# Patient Record
Sex: Male | Born: 1965 | Race: White | Hispanic: No | Marital: Married | Smoking: Never smoker
Health system: Southern US, Community
[De-identification: ages and names within clinical notes are randomized; demographics above are authoritative.]

## PROBLEM LIST (undated history)

## (undated) DIAGNOSIS — R011 Cardiac murmur, unspecified: Secondary | ICD-10-CM

## (undated) DIAGNOSIS — C8190 Hodgkin lymphoma, unspecified, unspecified site: Secondary | ICD-10-CM

## (undated) DIAGNOSIS — E079 Disorder of thyroid, unspecified: Secondary | ICD-10-CM

## (undated) DIAGNOSIS — C8590 Non-Hodgkin lymphoma, unspecified, unspecified site: Secondary | ICD-10-CM

## (undated) DIAGNOSIS — I10 Essential (primary) hypertension: Secondary | ICD-10-CM

## (undated) DIAGNOSIS — K219 Gastro-esophageal reflux disease without esophagitis: Secondary | ICD-10-CM

## (undated) DIAGNOSIS — G4730 Sleep apnea, unspecified: Secondary | ICD-10-CM

## (undated) HISTORY — PX: CHOLECYSTECTOMY: SHX55

## (undated) HISTORY — PX: SPLENECTOMY: SUR1306

## (undated) HISTORY — PX: OTHER SURGICAL HISTORY: SHX169

---

## 2018-09-05 ENCOUNTER — Emergency Department (HOSPITAL_COMMUNITY): Payer: PRIVATE HEALTH INSURANCE

## 2018-09-05 ENCOUNTER — Other Ambulatory Visit: Payer: Self-pay

## 2018-09-05 ENCOUNTER — Encounter (HOSPITAL_COMMUNITY): Payer: Self-pay | Admitting: Emergency Medicine

## 2018-09-05 ENCOUNTER — Inpatient Hospital Stay (HOSPITAL_COMMUNITY)
Admission: EM | Admit: 2018-09-05 | Discharge: 2018-09-09 | DRG: 175 | Disposition: A | Discharge status: Home / Self Care | Payer: PRIVATE HEALTH INSURANCE | Attending: Internal Medicine | Admitting: Internal Medicine

## 2018-09-05 DIAGNOSIS — G4700 Insomnia, unspecified: Secondary | ICD-10-CM | POA: Diagnosis present

## 2018-09-05 DIAGNOSIS — E039 Hypothyroidism, unspecified: Secondary | ICD-10-CM | POA: Diagnosis present

## 2018-09-05 DIAGNOSIS — R7989 Other specified abnormal findings of blood chemistry: Secondary | ICD-10-CM | POA: Diagnosis present

## 2018-09-05 DIAGNOSIS — F419 Anxiety disorder, unspecified: Secondary | ICD-10-CM | POA: Diagnosis not present

## 2018-09-05 DIAGNOSIS — F329 Major depressive disorder, single episode, unspecified: Secondary | ICD-10-CM | POA: Diagnosis present

## 2018-09-05 DIAGNOSIS — Z79899 Other long term (current) drug therapy: Secondary | ICD-10-CM

## 2018-09-05 DIAGNOSIS — Z8571 Personal history of Hodgkin lymphoma: Secondary | ICD-10-CM

## 2018-09-05 DIAGNOSIS — I2699 Other pulmonary embolism without acute cor pulmonale: Secondary | ICD-10-CM | POA: Diagnosis not present

## 2018-09-05 DIAGNOSIS — I2609 Other pulmonary embolism with acute cor pulmonale: Secondary | ICD-10-CM | POA: Diagnosis present

## 2018-09-05 DIAGNOSIS — I10 Essential (primary) hypertension: Secondary | ICD-10-CM | POA: Diagnosis present

## 2018-09-05 DIAGNOSIS — I351 Nonrheumatic aortic (valve) insufficiency: Secondary | ICD-10-CM | POA: Diagnosis not present

## 2018-09-05 DIAGNOSIS — K222 Esophageal obstruction: Secondary | ICD-10-CM | POA: Diagnosis present

## 2018-09-05 DIAGNOSIS — E785 Hyperlipidemia, unspecified: Secondary | ICD-10-CM | POA: Diagnosis present

## 2018-09-05 DIAGNOSIS — I2601 Septic pulmonary embolism with acute cor pulmonale: Secondary | ICD-10-CM | POA: Diagnosis not present

## 2018-09-05 DIAGNOSIS — Z9081 Acquired absence of spleen: Secondary | ICD-10-CM

## 2018-09-05 DIAGNOSIS — F320 Major depressive disorder, single episode, mild: Secondary | ICD-10-CM | POA: Diagnosis not present

## 2018-09-05 DIAGNOSIS — Z880 Allergy status to penicillin: Secondary | ICD-10-CM | POA: Diagnosis not present

## 2018-09-05 DIAGNOSIS — F411 Generalized anxiety disorder: Secondary | ICD-10-CM | POA: Diagnosis not present

## 2018-09-05 DIAGNOSIS — K219 Gastro-esophageal reflux disease without esophagitis: Secondary | ICD-10-CM | POA: Diagnosis present

## 2018-09-05 DIAGNOSIS — Z885 Allergy status to narcotic agent status: Secondary | ICD-10-CM

## 2018-09-05 DIAGNOSIS — F5101 Primary insomnia: Secondary | ICD-10-CM | POA: Diagnosis not present

## 2018-09-05 DIAGNOSIS — Z20828 Contact with and (suspected) exposure to other viral communicable diseases: Secondary | ICD-10-CM | POA: Diagnosis present

## 2018-09-05 DIAGNOSIS — F5102 Adjustment insomnia: Secondary | ICD-10-CM | POA: Diagnosis not present

## 2018-09-05 DIAGNOSIS — Z79890 Hormone replacement therapy (postmenopausal): Secondary | ICD-10-CM | POA: Diagnosis not present

## 2018-09-05 DIAGNOSIS — R0902 Hypoxemia: Secondary | ICD-10-CM | POA: Diagnosis not present

## 2018-09-05 DIAGNOSIS — Z9049 Acquired absence of other specified parts of digestive tract: Secondary | ICD-10-CM | POA: Diagnosis not present

## 2018-09-05 HISTORY — DX: Gastro-esophageal reflux disease without esophagitis: K21.9

## 2018-09-05 HISTORY — DX: Disorder of thyroid, unspecified: E07.9

## 2018-09-05 HISTORY — DX: Essential (primary) hypertension: I10

## 2018-09-05 HISTORY — DX: Sleep apnea, unspecified: G47.30

## 2018-09-05 HISTORY — DX: Cardiac murmur, unspecified: R01.1

## 2018-09-05 HISTORY — DX: Non-Hodgkin lymphoma, unspecified, unspecified site: C85.90

## 2018-09-05 HISTORY — DX: Hodgkin lymphoma, unspecified, unspecified site: C81.90

## 2018-09-05 LAB — CBC WITH DIFFERENTIAL/PLATELET
Abs Immature Granulocytes: 0.07 10*3/uL (ref 0.00–0.07)
Basophils Absolute: 0.1 10*3/uL (ref 0.0–0.1)
Basophils Relative: 1 %
Eosinophils Absolute: 0.1 10*3/uL (ref 0.0–0.5)
Eosinophils Relative: 1 %
HCT: 41.2 % (ref 39.0–52.0)
Hemoglobin: 14 g/dL (ref 13.0–17.0)
Immature Granulocytes: 1 %
Lymphocytes Relative: 11 %
Lymphs Abs: 1.4 10*3/uL (ref 0.7–4.0)
MCH: 37.8 pg — ABNORMAL HIGH (ref 26.0–34.0)
MCHC: 34 g/dL (ref 30.0–36.0)
MCV: 111.4 fL — ABNORMAL HIGH (ref 80.0–100.0)
Monocytes Absolute: 0.9 10*3/uL (ref 0.1–1.0)
Monocytes Relative: 7 %
Neutro Abs: 10.6 10*3/uL — ABNORMAL HIGH (ref 1.7–7.7)
Neutrophils Relative %: 79 %
Platelets: 144 10*3/uL — ABNORMAL LOW (ref 150–400)
RBC: 3.7 MIL/uL — ABNORMAL LOW (ref 4.22–5.81)
RDW: 15.4 % (ref 11.5–15.5)
WBC: 13.2 10*3/uL — ABNORMAL HIGH (ref 4.0–10.5)
nRBC: 2.4 % — ABNORMAL HIGH (ref 0.0–0.2)

## 2018-09-05 LAB — D-DIMER, QUANTITATIVE: D-Dimer, Quant: 6.16 ug/mL-FEU — ABNORMAL HIGH (ref 0.00–0.50)

## 2018-09-05 LAB — BASIC METABOLIC PANEL
Anion gap: 16 — ABNORMAL HIGH (ref 5–15)
BUN: 13 mg/dL (ref 6–20)
CO2: 20 mmol/L — ABNORMAL LOW (ref 22–32)
Calcium: 8.5 mg/dL — ABNORMAL LOW (ref 8.9–10.3)
Chloride: 96 mmol/L — ABNORMAL LOW (ref 98–111)
Creatinine, Ser: 1.19 mg/dL (ref 0.61–1.24)
GFR calc Af Amer: >60 mL/min (ref >60)
GFR calc non Af Amer: >60 mL/min (ref >60)
Glucose, Bld: 108 mg/dL — ABNORMAL HIGH (ref 70–99)
Potassium: 4 mmol/L (ref 3.5–5.1)
Sodium: 132 mmol/L — ABNORMAL LOW (ref 135–145)

## 2018-09-05 LAB — ECHOCARDIOGRAM COMPLETE
Height: 71.5 in
Weight: 3744 oz

## 2018-09-05 LAB — HEPARIN LEVEL (UNFRACTIONATED)
Heparin Unfractionated: 0.26 IU/mL — ABNORMAL LOW (ref 0.30–0.70)
Heparin Unfractionated: 0.52 IU/mL (ref 0.30–0.70)

## 2018-09-05 LAB — TROPONIN I (HIGH SENSITIVITY): Troponin I (High Sensitivity): 21.3 ng/L — ABNORMAL HIGH (ref <18)

## 2018-09-05 LAB — TROPONIN I: Troponin I: 0.04 ng/mL (ref <0.03)

## 2018-09-05 LAB — SARS CORONAVIRUS 2 BY RT PCR (HOSPITAL ORDER, PERFORMED IN ~~LOC~~ HOSPITAL LAB): SARS Coronavirus 2: NEGATIVE

## 2018-09-05 LAB — MRSA PCR SCREENING: MRSA by PCR: NEGATIVE

## 2018-09-05 LAB — BRAIN NATRIURETIC PEPTIDE: B Natriuretic Peptide: 495 pg/mL — ABNORMAL HIGH (ref 0.0–100.0)

## 2018-09-05 MED ORDER — SODIUM CHLORIDE (PF) 0.9 % IJ SOLN
INTRAMUSCULAR | Status: AC
  Administered 2018-09-05: 10:00:00
  Filled 2018-09-05: qty 50

## 2018-09-05 MED ORDER — HEPARIN (PORCINE) 25000 UT/250ML-% IV SOLN
1900.0000 [IU]/h | INTRAVENOUS | Status: AC
  Administered 2018-09-06 – 2018-09-08 (×4): 1900 [IU]/h via INTRAVENOUS
  Filled 2018-09-05 (×5): qty 250

## 2018-09-05 MED ORDER — PERFLUTREN LIPID MICROSPHERE
1.0000 mL | INTRAVENOUS | Status: AC | PRN
  Administered 2018-09-05: 12:00:00 2 mL via INTRAVENOUS
  Filled 2018-09-05: qty 10

## 2018-09-05 MED ORDER — HEPARIN BOLUS VIA INFUSION
1500.0000 [IU] | Freq: Once | INTRAVENOUS | Status: AC
  Administered 2018-09-05: 17:00:00 1500 [IU] via INTRAVENOUS
  Filled 2018-09-05: qty 1500

## 2018-09-05 MED ORDER — OXYCODONE-ACETAMINOPHEN 5-325 MG PO TABS
1.0000 | ORAL_TABLET | ORAL | Status: DC | PRN
  Administered 2018-09-05 – 2018-09-06 (×4): 1 via ORAL
  Filled 2018-09-05 (×4): qty 1

## 2018-09-05 MED ORDER — HEPARIN (PORCINE) 25000 UT/250ML-% IV SOLN
1700.0000 [IU]/h | INTRAVENOUS | Status: DC
  Administered 2018-09-05: 09:00:00 1700 [IU]/h via INTRAVENOUS
  Filled 2018-09-05: qty 250

## 2018-09-05 MED ORDER — CHLORHEXIDINE GLUCONATE CLOTH 2 % EX PADS
6.0000 | MEDICATED_PAD | Freq: Every day | CUTANEOUS | Status: DC
  Administered 2018-09-05 – 2018-09-08 (×4): 6 via TOPICAL

## 2018-09-05 MED ORDER — HEPARIN BOLUS VIA INFUSION
3000.0000 [IU] | Freq: Once | INTRAVENOUS | Status: AC
  Administered 2018-09-05: 3000 [IU] via INTRAVENOUS
  Filled 2018-09-05: qty 3000

## 2018-09-05 MED ORDER — IOHEXOL 350 MG/ML SOLN
100.0000 mL | Freq: Once | INTRAVENOUS | Status: AC | PRN
  Administered 2018-09-05: 10:00:00 100 mL via INTRAVENOUS

## 2018-09-05 MED ORDER — ASPIRIN 81 MG PO CHEW
324.0000 mg | CHEWABLE_TABLET | Freq: Once | ORAL | Status: AC
  Administered 2018-09-05: 07:00:00 324 mg via ORAL
  Filled 2018-09-05: qty 4

## 2018-09-05 MED ORDER — FENTANYL CITRATE (PF) 100 MCG/2ML IJ SOLN
25.0000 ug | Freq: Once | INTRAMUSCULAR | Status: AC
  Administered 2018-09-05: 25 ug via INTRAVENOUS
  Filled 2018-09-05: qty 2

## 2018-09-05 MED ORDER — SODIUM CHLORIDE 0.9 % IV BOLUS
1000.0000 mL | Freq: Once | INTRAVENOUS | Status: AC
  Administered 2018-09-05: 08:00:00 1000 mL via INTRAVENOUS

## 2018-09-05 NOTE — Progress Notes (Signed)
Pharmacy: [facility]  Patient's a 53 y.o M currently on heparin drip for acute PE with evidence of right heart strain.    First heparin level now back therapeutic at 0.52 (goal 0.3-0.7). Per pt's RN, no bleeding noted   Plan: - continue heparin drip at 1900 units/hr - Daily CBC/HL - monitor for s/s bleeding  Dorrene German 09/05/2018 11:59 PM

## 2018-09-05 NOTE — Progress Notes (Signed)
  Echocardiogram 2D Echocardiogram has been performed.  Bradley Campos 09/05/2018, 12:50 PM

## 2018-09-05 NOTE — Progress Notes (Signed)
[facility] Progress Note Patient Name: Bradley Campos DOB: 09-17-1965 MRN: [id number]   Date of Service  09/05/2018  HPI/Events of Note  Pt with chest discomfort due to known PE + headache  eICU Interventions  Percocet 1 tab po Q 4 hour prn pain        Frederik Pear 09/05/2018, 8:07 PM

## 2018-09-05 NOTE — Progress Notes (Signed)
He remains hemodynamically stable Requiring 2 L of oxygen  Elevated BNP and troponin  Echocardiogram noted  He does have right ventricular strain, but, stable hemodynamics and does not appear to be symptomatic from it at present  We will continue to monitor closely, continue current anticoagulation Catheter directed thrombolysis may be considered if any signs of decompensation

## 2018-09-05 NOTE — ED Notes (Signed)
ED TO INPATIENT HANDOFF REPORT  ED Nurse Name and Phone #: Nydia Bouton, RN  S Name/Age/Gender Randall Hiss Wyss 53 y.o. male Room/Bed: WA13/WA13  Code Status   Code Status: Full Code  Home/SNF/Other Home Patient oriented to: self, place, time and situation Is this baseline? Yes   Triage Complete: Triage complete  Chief Complaint Shortness of Breath  Triage Note Arrives via EMS from home. C/C SOB x6 hours, HA all night. Lung sounds clear, patient in no acute distress on RA. COVID negative as of Wednesday.   Pt reporting increasing shortness of breath with exertion.    Allergies Allergies  Allergen Reactions  . Codeine Hives  . Penicillins Hives    Did it involve swelling of the face/tongue/throat, SOB, or low BP? No Did it involve sudden or severe rash/hives, skin peeling, or any reaction on the inside of your mouth or nose? No Did you need to seek medical attention at a hospital or doctor's office? No When did it last happen?Childhood If all above answers are "NO", may proceed with cephalosporin use.     Level of Care/Admitting Diagnosis ED Disposition    ED Disposition Condition [facility] Area: [place] [[id number]]  Level of Care: ICU [6]  Covid Evaluation: Confirmed COVID Negative  Diagnosis: Pulmonary embolism [facility]) [[id number]]  Admitting Physician: Laurin Coder [[id number]]  Attending Physician: Laurin Coder [[id number]]  Estimated length of stay: past midnight tomorrow  Certification:: I certify this patient will need inpatient services for at least 2 midnights  PT Class (Do Not Modify): Inpatient [101]  PT Acc Code (Do Not Modify): Private [1]       B Medical/Surgery History Past Medical History:  Diagnosis Date  . Hodgkin's disease ([place])   . Hypertension   . Thyroid disease    Past Surgical History:  Procedure Laterality Date  . SPLENECTOMY       A IV Location/Drains/Wounds Patient Lines/Drains/Airways Status    Active Line/Drains/Airways    Name:   Placement date:   Placement time:   Site:   Days:   Peripheral IV 09/05/18 Left Antecubital   09/05/18    0710    Antecubital   less than 1   Peripheral IV 09/05/18 Right Antecubital   09/05/18    0909    Antecubital   less than 1          Intake/Output Last 24 hours  Intake/Output Summary (Last 24 hours) at 09/05/2018 1526 Last data filed at 09/05/2018 0909 Gross per 24 hour  Intake 1000 ml  Output -  Net 1000 ml    Labs/Imaging Results for orders placed or performed during the hospital encounter of 09/05/18 (from the past 48 hour(s))  CBC with Differential     Status: Abnormal   Collection Time: 09/05/18  7:04 AM  Result Value Ref Range   WBC 13.2 (H) 4.0 - 10.5 K/uL   RBC 3.70 (L) 4.22 - 5.81 MIL/uL   Hemoglobin 14.0 13.0 - 17.0 g/dL   HCT 41.2 39.0 - 52.0 %   MCV 111.4 (H) 80.0 - 100.0 fL   MCH 37.8 (H) 26.0 - 34.0 pg   MCHC 34.0 30.0 - 36.0 g/dL   RDW 15.4 11.5 - 15.5 %   Platelets 144 (L) 150 - 400 K/uL   nRBC 2.4 (H) 0.0 - 0.2 %   Neutrophils Relative % 79 %   Neutro Abs 10.6 (H) 1.7 - 7.7 K/uL   Lymphocytes Relative 11 %  Lymphs Abs 1.4 0.7 - 4.0 K/uL   Monocytes Relative 7 %   Monocytes Absolute 0.9 0.1 - 1.0 K/uL   Eosinophils Relative 1 %   Eosinophils Absolute 0.1 0.0 - 0.5 K/uL   Basophils Relative 1 %   Basophils Absolute 0.1 0.0 - 0.1 K/uL   Immature Granulocytes 1 %   Abs Immature Granulocytes 0.07 0.00 - 0.07 K/uL   Polychromasia PRESENT     Comment: Performed at [facility], [place] [address] [place], [place] [address]  Basic metabolic panel     Status: Abnormal   Collection Time: 09/05/18  7:04 AM  Result Value Ref Range   Sodium 132 (L) 135 - 145 mmol/L   Potassium 4.0 3.5 - 5.1 mmol/L   Chloride 96 (L) 98 - 111 mmol/L   CO2 20 (L) 22 - 32 mmol/L   Glucose, Bld 108 (H) 70 - 99 mg/dL   BUN 13 6 - 20 mg/dL   Creatinine, Ser 1.19 0.61 - 1.24 mg/dL   Calcium 8.5 (L) 8.9 - 10.3 mg/dL    GFR calc non Af Amer >60 >60 mL/min   GFR calc Af Amer >60 >60 mL/min   Anion gap 16 (H) 5 - 15    Comment: Performed at [facility], [place] [address] [place], [place] [address]  Troponin I - ONCE - STAT     Status: Abnormal   Collection Time: 09/05/18  7:04 AM  Result Value Ref Range   Troponin I 0.04 (HH) <0.03 ng/mL    Comment: CRITICAL RESULT CALLED TO, READ BACK BY AND VERIFIED WITH: BRILL,M @ [id number] ON [id number] BY POTEAT,S Performed at [place] [address] [place], [place] [address]   D-dimer, quantitative (not at [facility])     Status: Abnormal   Collection Time: 09/05/18  7:04 AM  Result Value Ref Range   D-Dimer, Quant 6.16 (H) 0.00 - 0.50 ug/mL-FEU    Comment: (NOTE) At the manufacturer cut-off of 0.50 ug/mL FEU, this assay has been documented to exclude PE with a sensitivity and negative predictive value of 97 to 99%.  At this time, this assay has not been approved by the FDA to exclude DVT/VTE. Results should be correlated with clinical presentation. Performed at [facility], [place] [address] [place], [place] [address]   SARS Coronavirus 2 (CEPHEID - Performed in [place] hospital lab), Hosp Order     Status: None   Collection Time: 09/05/18  8:08 AM   Specimen: Nasopharyngeal Swab  Result Value Ref Range   SARS Coronavirus 2 NEGATIVE NEGATIVE    Comment: (NOTE) If result is NEGATIVE SARS-CoV-2 target nucleic acids are NOT DETECTED. The SARS-CoV-2 RNA is generally detectable in upper and lower  respiratory specimens during the acute phase of infection. The lowest  concentration of SARS-CoV-2 viral copies this assay can detect is 250  copies / mL. A negative result does not preclude SARS-CoV-2 infection  and should not be used as the sole basis for treatment or other  patient management decisions.  A negative result may occur with  improper specimen collection / handling, submission of specimen other  than  nasopharyngeal swab, presence of viral mutation(s) within the  areas targeted by this assay, and inadequate number of viral copies  (<250 copies / mL). A negative result must be combined with clinical  observations, patient history, and epidemiological information. If result is POSITIVE SARS-CoV-2 target nucleic acids are DETECTED. The SARS-CoV-2 RNA  is generally detectable in upper and lower  respiratory specimens dur ing the acute phase of infection.  Positive  results are indicative of active infection with SARS-CoV-2.  Clinical  correlation with patient history and other diagnostic information is  necessary to determine patient infection status.  Positive results do  not rule out bacterial infection or co-infection with other viruses. If result is PRESUMPTIVE POSTIVE SARS-CoV-2 nucleic acids MAY BE PRESENT.   A presumptive positive result was obtained on the submitted specimen  and confirmed on repeat testing.  While 2019 novel coronavirus  (SARS-CoV-2) nucleic acids may be present in the submitted sample  additional confirmatory testing may be necessary for epidemiological  and / or clinical management purposes  to differentiate between  SARS-CoV-2 and other Sarbecovirus currently known to infect humans.  If clinically indicated additional testing with an alternate test  methodology [phone]) is advised. The SARS-CoV-2 RNA is generally  detectable in upper and lower respiratory sp ecimens during the acute  phase of infection. The expected result is Negative. Fact Sheet for Patients:  StrictlyIdeas.no Fact Sheet for Healthcare Providers: BankingDealers.co.za This test is not yet approved or cleared by the [place] FDA and has been authorized for detection and/or diagnosis of SARS-CoV-2 by FDA under an Emergency Use Authorization (EUA).  This EUA will remain in effect (meaning this test can be used) for the duration of  the COVID-19 declaration under Section 564(b)(1) of the Act, 21 U.S.C. section 360bbb-3(b)(1), unless the authorization is terminated or revoked sooner. Performed at [facility], [place] [address], [place], [place] [address]   Brain natriuretic peptide     Status: Abnormal   Collection Time: 09/05/18 12:07 PM  Result Value Ref Range   B Natriuretic Peptide 495.0 (H) 0.0 - 100.0 pg/mL    Comment: Performed at [facility], [place] [address] [place], [place] [address]  Troponin I (High Sensitivity)     Status: Abnormal   Collection Time: 09/05/18 12:07 PM  Result Value Ref Range   Troponin I (High Sensitivity) 21.3 (H) <18 ng/L    Comment: (NOTE) Elevated high sensitivity troponin I (hsTnI) values and significant  changes across serial measurements may suggest ACS but many other  chronic and acute conditions are known to elevate hsTnI results.  Refer to the "Links" section for chest pain algorithms and additional  guidance. Performed at [facility], [place] [address] [place], [place] [address]    Dg Chest 2 View  Result Date: 09/05/2018 CLINICAL DATA:  Shortness of breath EXAM: CHEST - 2 VIEW COMPARISON:  None. FINDINGS: Lungs are clear. Heart size and pulmonary vascularity are normal. No adenopathy. No bone lesions. IMPRESSION: No edema or consolidation. Electronically Signed   By: Lowella Grip III M.D.   On: 09/05/2018 07:46   Ct Angio Chest Pe W Or Wo Contrast  Result Date: 09/05/2018 CLINICAL DATA:  Shortness of breath.  History of Hodgkin's lymphoma EXAM: CT ANGIOGRAPHY CHEST WITH CONTRAST TECHNIQUE: Multidetector CT imaging of the chest was performed using the standard protocol during bolus administration of intravenous contrast. Multiplanar CT image reconstructions and MIPs were obtained to evaluate the vascular anatomy. CONTRAST:  87 mL OMNIPAQUE IOHEXOL 350 MG/ML SOLN COMPARISON:  Chest radiograph September 05, 2018 FINDINGS:  Cardiovascular: There are multiple pulmonary emboli extending into multiple upper and lower lobe pulmonary artery branches. Pulmonary emboli arise from the distal most aspects of each main pulmonary artery. The right ventricle to left ventricle diameter ratio is  1.5, indicative of right heart strain. There is no thoracic aortic aneurysm or dissection. There are foci of aortic atherosclerosis. The visualized great vessels appear unremarkable. Note that the right innominate and left common carotid arteries arise as a common trunk, an anatomic variant. There is no demonstrable pericardial effusion or pericardial thickening. There are foci of coronary artery calcification. Mediastinum/Nodes: Thyroid appears diminutive. No thyroid lesions evident. No evident thoracic adenopathy. No esophageal lesions evident. Lungs/Pleura: There are areas of mild atelectatic change bilaterally. There is no evident edema or consolidation. On axial slice 31 series 11, there is a 2 mm nodular opacity in the a pickle segment of the right upper lobe. On axial slice 55 series 11, there is a 2 mm nodular opacity in the anterior segment of the right upper lobe. On axial slice 72 series 11, there is a 4 mm nodular opacity in the medial segment of the right middle lobe. On axial slice 70 series 11, there is a 5 mm nodular opacity in the medial segment right middle lobe. On axial slice 79 series 11, there is a 4 mm nodular opacity in the medial segment right middle lobe. There is a 2 mm nodular opacity in the lateral segment right middle lobe on axial slice 77 series 11. No pleural effusion or pleural thickening evident. No pulmonary infarct is demonstrable on this study. Upper Abdomen: There is hepatic steatosis. Spleen is surgically absent. Gallbladder is absent. Musculoskeletal: There is anterior wedging of the L1 vertebral body. No blastic or lytic bone lesions are evident. No chest wall lesions appreciable. Review of the MIP images confirms  the above findings. IMPRESSION: 1. Extensive pulmonary embolus bilaterally with evidence of right heart strain. Positive for acute PE with CT evidence of right heart strain (RV/LV Ratio = 1.5) consistent with at least submassive (intermediate risk) PE. The presence of right heart strain has been associated with an increased risk of morbidity and mortality. Please activate Code PE by paging [phone]. 2. Several small nodular opacities in the lungs, measuring up to 5 mm in length. Given history of neoplasm, a follow-up CT in approximately 3 months to assess for stability would be warranted. No airspace consolidation. 3.  Spleen absent.  No adenopathy evident. 4.  Hepatic steatosis.  Gallbladder absent. 5. Foci of aortic atherosclerosis are noted. There also foci of coronary artery calcification. Aortic Atherosclerosis (ICD10-I70.0). Critical Value/emergent results were called by telephone at the time of interpretation on 09/05/2018 at 10:27 am to Dr. Gerlene Fee , who verbally acknowledged these results. Electronically Signed   By: Lowella Grip III M.D.   On: 09/05/2018 10:27    Pending Labs Unresulted Labs (From admission, onward)    Start     Ordered   09/06/18 0500  CBC  Daily,   R     09/05/18 0847   09/05/18 1500  Heparin level (unfractionated)  Once-Timed,   STAT     09/05/18 0847   09/05/18 1140  HIV antibody (Routine Testing)  Once,   STAT     09/05/18 1141          Vitals/Pain Today's Vitals   09/05/18 0930 09/05/18 1100 09/05/18 1130 09/05/18 1500  BP: (!) 140/93 131/85 (!) 144/98 (!) 131/92  Pulse: 99 100 100 (!) 103  Resp: (!) 22 (!) 25 16 (!) 32  Temp:      TempSrc:      SpO2: 93% 95% 97% 93%  Weight:      Height:  PainSc:        Isolation Precautions No active isolations  Medications Medications  heparin ADULT infusion 100 units/mL (25000 units/264mL sodium chloride 0.45%) (1,700 Units/hr Intravenous New Bag/Given 09/05/18 0902)  perflutren lipid  microspheres (DEFINITY) IV suspension (2 mLs Intravenous Given 09/05/18 1229)  aspirin chewable tablet 324 mg (324 mg Oral Given 09/05/18 0724)  fentaNYL (SUBLIMAZE) injection 25 mcg (25 mcg Intravenous Given 09/05/18 0721)  sodium chloride 0.9 % bolus 1,000 mL (0 mLs Intravenous Stopped 09/05/18 0909)  heparin bolus via infusion 3,000 Units (3,000 Units Intravenous Bolus from Bag 09/05/18 0905)  iohexol (OMNIPAQUE) 350 MG/ML injection 100 mL (100 mLs Intravenous Contrast Given 09/05/18 1004)  sodium chloride (PF) 0.9 % injection (  Given by Other 09/05/18 1001)    Mobility walks with person assist Low fall risk   Focused Assessments Pulmonary Assessment Handoff:  Lung sounds: Bilateral Breath Sounds: Clear L Breath Sounds: Clear R Breath Sounds: Clear O2 Device: Nasal Cannula O2 Flow Rate (L/min): 2 L/min      R Recommendations: See Admitting Provider Note  Report given to:   Additional Notes:

## 2018-09-05 NOTE — Progress Notes (Signed)
ANTICOAGULATION CONSULT NOTE - Initial Consult  Pharmacy Consult for heparin Indication: suspected pulmonary embolus  Allergies  Allergen Reactions  . Codeine Hives  . Penicillins Hives    Did it involve swelling of the face/tongue/throat, SOB, or low BP? No Did it involve sudden or severe rash/hives, skin peeling, or any reaction on the inside of your mouth or nose? No Did you need to seek medical attention at a hospital or doctor's office? No When did it last happen?Childhood If all above answers are "NO", may proceed with cephalosporin use.     Patient Measurements: Height: 5' 11.5" (181.6 cm) Weight: 234 lb (106.1 kg) IBW/kg (Calculated) : 76.45 Heparin Dosing Weight: 99 kg  Vital Signs: Temp: 97.3 F (36.3 C) (06/23 0636) Temp Source: Oral (06/23 0636) BP: 137/102 (06/23 0800) Pulse Rate: 103 (06/23 0800)  Labs: Recent Labs    09/05/18 0704  HGB 14.0  HCT 41.2  PLT 144*  CREATININE 1.19  TROPONINI 0.04*    Estimated Creatinine Clearance: 92.8 mL/min (by C-G formula based on SCr of 1.19 mg/dL).   Medical History: Past Medical History:  Diagnosis Date  . Hodgkin's disease ([place])   . Hypertension   . Thyroid disease     Assessment: Pt is a 33 YOM presenting w/ cc of SOB. COVID-19 test neg. D-dimer was found to be elevated. To start heparin drip for rule out PE.  Goal of Therapy:  Heparin level 0.3-0.7 units/ml Monitor platelets by anticoagulation protocol: Yes   Plan:  - Give heparin 3000 units bolus x1, then heparin drip @ 1700 unit/hr. - Check heparin level in 6 hours - Monitor CBC daily - Monitor for s/s of bleeding - Follow up CTA results  Natale Lay 09/05/2018,8:25 AM

## 2018-09-05 NOTE — Progress Notes (Signed)
Patient valuables delivered to security and lock box key placed on patient chart.

## 2018-09-05 NOTE — ED Triage Notes (Addendum)
Arrives via EMS from home. C/C SOB x6 hours, HA all night. Lung sounds clear, patient in no acute distress on RA. COVID negative as of Wednesday.

## 2018-09-05 NOTE — H&P (Signed)
NAME:  Bradley Campos, MRN:  [id number], DOB:  [birth year], LOS: 0 ADMISSION DATE:  09/05/2018, CONSULTATION DATE:  09/05/2018  REFERRING MD:  Dr Sedonia Small, CHIEF COMPLAINT:  Shortness of breath  Brief History   Patient came into the hospital with shortness of breath Intermittent chest pain and discomfort  History of present illness   Relatively healthy gentleman with a history of hypertension, hyperlipidemia, esophageal stenosis that requires intermittent dilatation Was not feeling well for about a week Drove about 3 hours yesterday to come to work When he got to work he could not function he could not walk up a flight of steps Was able to make it back home was feeling dizzy intermittently There was associated chest discomfort, nonradiating With worsening symptoms decided come to the hospital today  Past Medical History   Past Medical History:  Diagnosis Date  . Hodgkin's disease ([place])   . Hypertension   . Thyroid disease    Significant Hospital Events     Consults:  Interventional radiology 09/05/2018  Procedures:    Significant Diagnostic Tests:  CT scan of the chest Showing significant clot burden with right ventricular/left ventricular ratio 1.5  Micro Data:    Antimicrobials:     Interim history/subjective:  He feels comfortable Intermittent chest pains Shortness of breath even in conversation  Objective   Blood pressure 131/85, pulse 100, temperature (!) 97.3 F (36.3 C), temperature source Oral, resp. rate (!) 25, height 5' 11.5" (1.816 m), weight 106.1 kg, SpO2 95 %.        Intake/Output Summary (Last 24 hours) at 09/05/2018 1142 Last data filed at 09/05/2018 0909 Gross per 24 hour  Intake 1000 ml  Output -  Net 1000 ml   Filed Weights   09/05/18 0649  Weight: 106.1 kg    Examination: General: Middle-age gentleman, does not appear to be in extremis HENT: Moist oral mucosa Lungs: Clear breath sounds bilaterally Cardiovascular: S1-S2 appreciated,  systolic murmur Abdomen: Soft, bowel sounds appreciated Extremities: No clubbing, no edema Neuro: Alert and oriented x3 GU:   Resolved Hospital Problem list     Assessment & Plan:  Pulmonary embolism -Submassive -Hemodynamically stable -Large clot burden -Requiring supplemental oxygen -Significant shortness of breath with any activity with lightheadedness  He has no history of bleeding episodes, no previous CVA, he did have a recent dilatation of esophageal narrowing, no history of malignancy, blood pressure always controlled -Has no clear-cut contraindication to thrombolytics  Will obtain a stat echocardiogram Obtain BNP and troponin There appears to be significant strain on the right ventricle based on CT scan findings  We will consult interventional radiology for evaluation and consideration for EKOS  Continue anticoagulation with heparin at present  Admit to the intensive care unit  Hypothyroidism -Continue Synthroid  Discussed with Dr. Ileene Hutchinson -Will continue heparin at the present time, EKOS may be considered if any decompensation   Best practice:  Diet: Regular DVT prophylaxis: On full anticoagulation with heparin Mobility: Bedrest Code Status: full Code Disposition: Admit to ICU  Labs   CBC: Recent Labs  Lab 09/05/18 0704  WBC 13.2*  NEUTROABS 10.6*  HGB 14.0  HCT 41.2  MCV 111.4*  PLT 144*    Basic Metabolic Panel: Recent Labs  Lab 09/05/18 0704  NA 132*  K 4.0  CL 96*  CO2 20*  GLUCOSE 108*  BUN 13  CREATININE 1.19  CALCIUM 8.5*   GFR: Estimated Creatinine Clearance: 92.8 mL/min (by C-G formula based on SCr of 1.19 mg/dL).  Recent Labs  Lab 09/05/18 0704  WBC 13.2*    Liver Function Tests: No results for input(s): AST, ALT, ALKPHOS, BILITOT, PROT, ALBUMIN in the last 168 hours. No results for input(s): LIPASE, AMYLASE in the last 168 hours. No results for input(s): AMMONIA in the last 168 hours.  ABG No results found  for: PHART, PCO2ART, PO2ART, HCO3, TCO2, ACIDBASEDEF, O2SAT   Coagulation Profile: No results for input(s): INR, PROTIME in the last 168 hours.  Cardiac Enzymes: Recent Labs  Lab 09/05/18 0704  TROPONINI 0.04*    HbA1C: No results found for: HGBA1C  CBG: No results for input(s): GLUCAP in the last 168 hours.  Review of Systems:   Review of Systems  Constitutional: Negative.   HENT: Negative.   Eyes: Negative.   Respiratory: Negative.  Negative for cough.   Cardiovascular: Positive for chest pain. Negative for orthopnea.  Gastrointestinal: Negative.   Genitourinary: Negative.   Skin: Negative.      Past Medical History  He,  has a past medical history of Hodgkin's disease ([place]), Hypertension, and Thyroid disease.   Surgical History    Past Surgical History:  Procedure Laterality Date  . SPLENECTOMY       Social History   reports that he has never smoked. He has never used smokeless tobacco. He reports current alcohol use. He reports that he does not use drugs.   Family History   His family history is not on file.   Allergies Allergies  Allergen Reactions  . Codeine Hives  . Penicillins Hives    Did it involve swelling of the face/tongue/throat, SOB, or low BP? No Did it involve sudden or severe rash/hives, skin peeling, or any reaction on the inside of your mouth or nose? No Did you need to seek medical attention at a hospital or doctor's office? No When did it last happen?Childhood If all above answers are "NO", may proceed with cephalosporin use.      Home Medications  Prior to Admission medications   Medication Sig Start Date End Date Taking? Authorizing Provider  ALPRAZolam Duanne Moron) 0.5 MG tablet Take 0.5 mg by mouth 3 (three) times daily as needed for anxiety or sleep.  07/10/18  Yes [provider]  atorvastatin (LIPITOR) 80 MG tablet Take 80 mg by mouth daily. 07/06/18  Yes [provider]  cyanocobalamin (,VITAMIN B-12,) 1000  MCG/ML injection Inject 1,000 mcg into the muscle every 30 (thirty) days.  08/13/18  Yes [provider]  folic acid (FOLVITE) 1 MG tablet Take 1 mg by mouth daily. 07/10/18  Yes [provider]  levothyroxine (SYNTHROID) 125 MCG tablet Take 125 mcg by mouth daily. 07/12/18  Yes [provider]  levothyroxine (SYNTHROID) 200 MCG tablet Take 200 mcg by mouth daily. 07/11/18  Yes [provider]  losartan (COZAAR) 100 MG tablet Take 100 mg by mouth at bedtime.   Yes [provider]  pregabalin (LYRICA) 50 MG capsule Take 50 mg by mouth 2 (two) times daily as needed (pain).  08/17/18  Yes [provider]  RABEprazole (ACIPHEX) 20 MG tablet Take 20 mg by mouth 2 (two) times a day. 07/07/18  Yes [provider]  testosterone cypionate (DEPOTESTOSTERONE CYPIONATE) 200 MG/ML injection Inject 100 mg into the muscle every 14 (fourteen) days.  04/03/18  Yes [provider]  Verapamil HCl CR 300 MG CP24 Take 300 mg by mouth at bedtime. 07/06/18  Yes [provider]     The patient is critically  ill with multiple organ system failure and requires high complexity decision making for assessment and support, frequent evaluation and titration of therapies, advanced monitoring, review of radiographic studies and interpretation of complex data.    Critical Care Time devoted to patient care services, exclusive of separately billable procedures, described in this note is 30 minutes.   Sherrilyn Rist, MD [place], PCCM Cell: [phone]

## 2018-09-05 NOTE — Progress Notes (Signed)
Pharmacy: Re-heparin  Patient's a 53 y.o M currently on heparin drip for acute PE with evidence of right heart strain.    First heparin level now back sub-therapeutic at 0.26 (goal 0.3-0.7). Per pt's RN, no bleeding noted   Plan: - heparin 1500 units bolus, then increase drip to 1900 units/hr - check 6 hr heparin level - monitor for s/s bleeding  Dia Sitter, PharmD, BCPS 09/05/2018 5:12 PM

## 2018-09-05 NOTE — ED Provider Notes (Signed)
[facility] Emergency Department Provider Note MRN:  [id number]  Arrival date & time: 09/05/18     Chief Complaint   Shortness of Breath   History of Present Illness   Bradley Campos is a 53 y.o. year-old male with a history of hypertension, hyperlipidemia presenting to the ED with chief complaint of shortness of breath.  Patient drove here from a different time, 3-hour trip, upon arrival in getting of the car he felt short of breath, worse with ambulating.  Also endorsing central chest tightness, nonradiating, mild in severity.  Feeling dizzy intermittently.  Endorsing nausea, malaise, weakness, denies vomiting.  Denies history of blood clots.  Denies tobacco use.  Mother and father both died of heart attacks in their 37s.  Denies abdominal pain, no pain or swelling in the legs.  Review of Systems  A complete 10 system review of systems was obtained and all systems are negative except as noted in the HPI and PMH.   Patient's Health History    Past Medical History:  Diagnosis Date  . Hodgkin's disease ([place])   . Hypertension   . Thyroid disease     Past Surgical History:  Procedure Laterality Date  . SPLENECTOMY      History reviewed. No pertinent family history.  Social History   Socioeconomic History  . Marital status: Married    Spouse name: Not on file  . Number of children: Not on file  . Years of education: Not on file  . Highest education level: Not on file  Occupational History  . Not on file  Social Needs  . Financial resource strain: Not on file  . Food insecurity    Worry: Not on file    Inability: Not on file  . Transportation needs    Medical: Not on file    Non-medical: Not on file  Tobacco Use  . Smoking status: Never Smoker  . Smokeless tobacco: Never Used  Substance and Sexual Activity  . Alcohol use: Yes  . Drug use: Never  . Sexual activity: Not on file  Lifestyle  . Physical activity    Days per week: Not on file    Minutes  per session: Not on file  . Stress: Not on file  Relationships  . Social Herbalist on phone: Not on file    Gets together: Not on file    Attends religious service: Not on file    Active member of club or organization: Not on file    Attends meetings of clubs or organizations: Not on file    Relationship status: Not on file  . Intimate partner violence    Fear of current or ex partner: Not on file    Emotionally abused: Not on file    Physically abused: Not on file    Forced sexual activity: Not on file  Other Topics Concern  . Not on file  Social History Narrative  . Not on file     Physical Exam  Vital Signs and Nursing Notes reviewed Vitals:   09/05/18 0900 09/05/18 0930  BP: (!) 141/99 (!) 140/93  Pulse: 98 99  Resp: (!) 22 (!) 22  Temp:    SpO2: 96% 93%    CONSTITUTIONAL: Well-appearing, NAD NEURO:  Alert and oriented x 3, no focal deficits EYES:  eyes equal and reactive ENT/NECK:  no LAD, no JVD CARDIO: Tachycardic rate, well-perfused, normal S1 and S2 PULM:  CTAB no wheezing or rhonchi, tachypneic GI/GU:  normal bowel sounds, non-distended, non-tender MSK/SPINE:  No gross deformities, no edema SKIN:  no rash, atraumatic PSYCH:  Appropriate speech and behavior  Diagnostic and Interventional Summary    EKG Interpretation  Date/Time:  Tuesday September 05 2018 07:05:42 EDT Ventricular Rate:  104 PR Interval:    QRS Duration: 98 QT Interval:  383 QTC Calculation: 504 R Axis:   70 Text Interpretation:  Sinus tachycardia Inferior infarct, age indeterminate Probable anterior infarct, age indeterminate Prolonged QT interval Confirmed by Gerlene Fee [phone]) on 09/05/2018 7:12:59 AM      Labs Reviewed  CBC WITH DIFFERENTIAL/PLATELET - Abnormal; Notable for the following components:      Result Value   WBC 13.2 (*)    RBC 3.70 (*)    MCV 111.4 (*)    MCH 37.8 (*)    Platelets 144 (*)    nRBC 2.4 (*)    Neutro Abs 10.6 (*)    All other components  within normal limits  BASIC METABOLIC PANEL - Abnormal; Notable for the following components:   Sodium 132 (*)    Chloride 96 (*)    CO2 20 (*)    Glucose, Bld 108 (*)    Calcium 8.5 (*)    Anion gap 16 (*)    All other components within normal limits  TROPONIN I - Abnormal; Notable for the following components:   Troponin I 0.04 (*)    All other components within normal limits  D-DIMER, QUANTITATIVE (NOT AT [facility]) - Abnormal; Notable for the following components:   D-Dimer, Quant 6.16 (*)    All other components within normal limits  SARS CORONAVIRUS 2 (HOSPITAL ORDER, [place] LAB)  HEPARIN LEVEL (UNFRACTIONATED)    CT ANGIO CHEST PE W OR WO CONTRAST  Final Result    DG Chest 2 View  Final Result      Medications  heparin ADULT infusion 100 units/mL (25000 units/258mL sodium chloride 0.45%) (1,700 Units/hr Intravenous New Bag/Given 09/05/18 0902)  aspirin chewable tablet 324 mg (324 mg Oral Given 09/05/18 0724)  fentaNYL (SUBLIMAZE) injection 25 mcg (25 mcg Intravenous Given 09/05/18 0721)  sodium chloride 0.9 % bolus 1,000 mL (0 mLs Intravenous Stopped 09/05/18 0909)  heparin bolus via infusion 3,000 Units (3,000 Units Intravenous Bolus from Bag 09/05/18 0905)  iohexol (OMNIPAQUE) 350 MG/ML injection 100 mL (100 mLs Intravenous Contrast Given 09/05/18 1004)  sodium chloride (PF) 0.9 % injection (  Given by Other 09/05/18 1001)     Procedures Critical Care Critical Care Documentation Critical care time provided by me (excluding procedures): 44 minutes  Condition necessitating critical care: Acute pulmonary emboli with right heart strain  Components of critical care management: reviewing of prior records, laboratory and imaging interpretation, frequent re-examination and reassessment of vital signs, administration of IV heparin, discussion with consulting services.    ED Course and Medical Decision Making  I have reviewed the triage vital signs and the  nursing notes.  Pertinent labs & imaging results that were available during my care of the patient were reviewed by me and considered in my medical decision making (see below for details).  Considering ACS versus PE, labs pending.  Anticipating admission.  Clinical Course as of Sep 04 1104  Tue Sep 05, 2018  1028 Received call by radiology, patient has a large PE clot burden with right heart strain, will discuss with intensivist.  Patient was started empirically on heparin prior to CT imaging given the high suspicion.   [MB]  Clinical Course User Index [MB] Maudie Flakes, MD    CT confirms large clot burden bilaterally with elevated ratio of right heart strain.  Patient to be admitted to intensivist service for possible procedural intervention.  Barth Kirks. Sedonia Small, [place] [email or web address]  Final Clinical Impressions(s) / ED Diagnoses     ICD-10-CM   1. Acute pulmonary embolism with acute cor pulmonale, unspecified pulmonary embolism type [facility])  I26.09     ED Discharge Orders    None         Maudie Flakes, MD 09/05/18 [phone]

## 2018-09-05 NOTE — ED Notes (Signed)
Bed: AJ68 Expected date:  Expected time:  Means of arrival:  Comments: 53 yo M/ shortness of breath

## 2018-09-05 NOTE — ED Notes (Signed)
Patient transported to X-ray 

## 2018-09-05 NOTE — ED Triage Notes (Signed)
Pt reporting increasing shortness of breath with exertion.

## 2018-09-05 NOTE — ED Notes (Signed)
Date and time results received: 09/05/18 7:47 AM  (use smartphrase ".now" to insert current time)  Test: Trop Critical Value: 0.04  Name of Provider Notified: Bero  Orders Received? Or Actions Taken?: awaiting orders

## 2018-09-05 NOTE — Progress Notes (Signed)
Home meds sent to pharmacy.

## 2018-09-06 ENCOUNTER — Encounter (HOSPITAL_COMMUNITY): Payer: PRIVATE HEALTH INSURANCE

## 2018-09-06 LAB — CBC
HCT: 41.2 % (ref 39.0–52.0)
Hemoglobin: 13.2 g/dL (ref 13.0–17.0)
MCH: 36.5 pg — ABNORMAL HIGH (ref 26.0–34.0)
MCHC: 32 g/dL (ref 30.0–36.0)
MCV: 113.8 fL — ABNORMAL HIGH (ref 80.0–100.0)
Platelets: 130 10*3/uL — ABNORMAL LOW (ref 150–400)
RBC: 3.62 MIL/uL — ABNORMAL LOW (ref 4.22–5.81)
RDW: 15.2 % (ref 11.5–15.5)
WBC: 13.1 10*3/uL — ABNORMAL HIGH (ref 4.0–10.5)
nRBC: 1.7 % — ABNORMAL HIGH (ref 0.0–0.2)

## 2018-09-06 LAB — HEPARIN LEVEL (UNFRACTIONATED): Heparin Unfractionated: 0.5 IU/mL (ref 0.30–0.70)

## 2018-09-06 LAB — HIV ANTIBODY (ROUTINE TESTING W REFLEX): HIV Screen 4th Generation wRfx: NONREACTIVE

## 2018-09-06 MED ORDER — TESTOSTERONE CYPIONATE 200 MG/ML IM SOLN: 100.0000 mg | INTRAMUSCULAR | Status: DC

## 2018-09-06 MED ORDER — ONDANSETRON HCL 4 MG/2ML IJ SOLN
INTRAMUSCULAR | Status: AC
  Administered 2018-09-06: 18:00:00
  Filled 2018-09-06: qty 2

## 2018-09-06 MED ORDER — PANTOPRAZOLE SODIUM 40 MG PO TBEC
40.0000 mg | DELAYED_RELEASE_TABLET | Freq: Every day | ORAL | Status: DC
  Administered 2018-09-06 – 2018-09-09 (×4): 40 mg via ORAL
  Filled 2018-09-06 (×4): qty 1

## 2018-09-06 MED ORDER — ATORVASTATIN CALCIUM 40 MG PO TABS
80.0000 mg | ORAL_TABLET | Freq: Every day | ORAL | Status: DC
  Administered 2018-09-06 – 2018-09-08 (×3): 80 mg via ORAL
  Filled 2018-09-06 (×3): qty 2

## 2018-09-06 MED ORDER — ONDANSETRON HCL 4 MG/2ML IJ SOLN
4.0000 mg | Freq: Four times a day (QID) | INTRAMUSCULAR | Status: DC | PRN
  Administered 2018-09-06 (×2): 4 mg via INTRAVENOUS
  Filled 2018-09-06: qty 2

## 2018-09-06 MED ORDER — ALPRAZOLAM 0.5 MG PO TABS
0.5000 mg | ORAL_TABLET | Freq: Three times a day (TID) | ORAL | Status: DC | PRN
  Administered 2018-09-06 – 2018-09-09 (×3): 0.5 mg via ORAL
  Filled 2018-09-06 (×3): qty 1

## 2018-09-06 MED ORDER — LEVOTHYROXINE SODIUM 100 MCG PO TABS: 200.0000 ug | ORAL_TABLET | Freq: Every day | ORAL | Status: DC

## 2018-09-06 MED ORDER — LEVOTHYROXINE SODIUM 25 MCG PO TABS
325.0000 ug | ORAL_TABLET | Freq: Every day | ORAL | Status: DC
  Administered 2018-09-06 – 2018-09-09 (×4): 325 ug via ORAL
  Filled 2018-09-06 (×4): qty 3

## 2018-09-06 MED ORDER — LEVOTHYROXINE SODIUM 25 MCG PO TABS: 125.0000 ug | ORAL_TABLET | Freq: Every day | ORAL | Status: DC

## 2018-09-06 MED ORDER — FOLIC ACID 1 MG PO TABS
1.0000 mg | ORAL_TABLET | Freq: Every day | ORAL | Status: DC
  Administered 2018-09-06 – 2018-09-09 (×4): 1 mg via ORAL
  Filled 2018-09-06 (×4): qty 1

## 2018-09-06 MED ORDER — PREGABALIN 50 MG PO CAPS
50.0000 mg | ORAL_CAPSULE | Freq: Two times a day (BID) | ORAL | Status: DC | PRN
  Administered 2018-09-08: 20:00:00 50 mg via ORAL
  Filled 2018-09-06: qty 1

## 2018-09-06 MED ORDER — LOSARTAN POTASSIUM 50 MG PO TABS
100.0000 mg | ORAL_TABLET | Freq: Every day | ORAL | Status: DC
  Administered 2018-09-06 – 2018-09-08 (×3): 100 mg via ORAL
  Filled 2018-09-06 (×3): qty 2

## 2018-09-06 NOTE — Progress Notes (Signed)
ANTICOAGULATION CONSULT NOTE - Initial Consult  Pharmacy Consult for heparin Indication: suspected pulmonary embolus  Allergies  Allergen Reactions  . Codeine Hives  . Penicillins Hives    Did it involve swelling of the face/tongue/throat, SOB, or low BP? No Did it involve sudden or severe rash/hives, skin peeling, or any reaction on the inside of your mouth or nose? No Did you need to seek medical attention at a hospital or doctor's office? No When did it last happen?Childhood If all above answers are "NO", may proceed with cephalosporin use.     Patient Measurements: Height: 5\' 11"  (180.3 cm) Weight: 233 lb 11 oz (106 kg) IBW/kg (Calculated) : 75.3 Heparin Dosing Weight: 99 kg  Vital Signs: Temp: 97.4 F (36.3 C) (06/24 0800) Temp Source: Oral (06/24 0800) BP: 148/90 (06/24 0900) Pulse Rate: 85 (06/24 0900)  Labs: Recent Labs    09/05/18 0704 09/05/18 1642 09/05/18 2254 09/06/18 0250  HGB 14.0  --   --  13.2  HCT 41.2  --   --  41.2  PLT 144*  --   --  130*  HEPARINUNFRC  --  0.26* 0.52 0.50  CREATININE 1.19  --   --   --   TROPONINI 0.04*  --   --   --     Estimated Creatinine Clearance: 92 mL/min (by C-G formula based on SCr of 1.19 mg/dL).   Assessment: Pt is a 67 YOM presenting w/ cc of SOB. COVID-19 test neg. D-dimer was found to be elevated. To start heparin drip for rule out PE.   Baseline INR, aPTT: not done  Prior anticoagulation: none  Significant events:  Today, 09/06/2018:  CBC: Hgb remains stable, Plt slightly low today  Confirmatory heparin level therapeutic on 1900 units/hr  No bleeding or infusion issues per nursing  Still requiring 1L O2 and with intermittent chest pain per RN  Goal of Therapy: Heparin level 0.3-0.7 units/ml Monitor platelets by anticoagulation protocol: Yes  Plan:  Continue heparin IV infusion at 1900 units/hr, still some headroom with heparin level, so could consider nudging up slightly if remains  symptomatic  Daily CBC and heparin level  Monitor for signs of bleeding or thrombosis   Reuel Boom, PharmD, BCPS [phone] 09/06/2018, 10:58 AM

## 2018-09-06 NOTE — Progress Notes (Signed)
NAME:  Bradley Campos, MRN:  [id number], DOB:  10-Mar-1966, LOS: 1 ADMISSION DATE:  09/05/2018, CONSULTATION DATE:  09/05/2018  REFERRING MD:  Dr Sedonia Small, CHIEF COMPLAINT:  Shortness of breath  Brief History   Patient came into the hospital with shortness of breath Intermittent chest pain and discomfort  History of present illness   Relatively healthy gentleman with a history of hypertension, hyperlipidemia, esophageal stenosis that requires intermittent dilatation Was not feeling well for about a week Drove about 3 hours yesterday to come to work When he got to work he could not function he could not walk up a flight of steps Was able to make it back home was feeling dizzy intermittently There was associated chest discomfort, nonradiating With worsening symptoms decided come to the hospital today  Past Medical History   Past Medical History:  Diagnosis Date  . GERD (gastroesophageal reflux disease)   . Heart murmur    eval 09/05/18  . Hodgkin's disease ([place])   . Hypertension   . Lymphoma [facility])    age 53  . Sleep apnea    not on cpap  . Thyroid disease    Significant Hospital Events     Consults:  Discussed with IR 09/05/2018  Procedures:  None  Significant Diagnostic Tests:  CT scan of the chest Showing significant clot burden with right ventricular/left ventricular ratio 1.5  Interim history/subjective:  Comfortable, intermittent chest pains Required pain meds overnight Breathing remains about the same  Objective   Blood pressure (!) 160/96, pulse 88, temperature (!) 97.4 F (36.3 C), temperature source Oral, resp. rate 19, height 5\' 11"  (1.803 m), weight 106 kg, SpO2 95 %.        Intake/Output Summary (Last 24 hours) at 09/06/2018 0854 Last data filed at 09/06/2018 0800 Gross per 24 hour  Intake 1390.95 ml  Output 2150 ml  Net -759.05 ml   Filed Weights   09/05/18 0649 09/05/18 1644 09/06/18 0500  Weight: 106.1 kg 107.7 kg 106 kg    Examination:  Middle-age gentleman, does not appear to be in extremities Moist oral mucosa Clear breath sounds bilaterally S1-S2 appreciated Abdomen is soft, bowel sounds appreciated Extremities shows no clubbing no edema  neurologically alert and oriented x3  Resolved Hospital Problem list     Assessment & Plan:   Pulmonary embolism Submassive Hemodynamically stable Relatively large clot burden Requiring supplemental oxygen although this is improving Shortness of breath with activity Echocardiogram with preserved left-sided functions Some strain on the right, elevated BNP, elevated troponin  Continue anticoagulation with heparin If he continues to stabilize the plan will be to transition to DOAC  Hypothyroidism Continue Synthroid  We will consider catheter directed thrombolysis if any signs of decompensation  Oxygen right around 90%, does trend down a little bit off oxygen We will decrease to 1 L at present  Does not appear to be significantly symptomatic with his right ventricular strain  All meds reviewed and reordered Holding antihypertensives at present  Best practice:  Diet: Regular diet DVT prophylaxis: Anticoagulation Mobility: Bedrest Code Status: full Code Disposition: ICU  Labs   CBC: Recent Labs  Lab 09/05/18 0704 09/06/18 0250  WBC 13.2* 13.1*  NEUTROABS 10.6*  --   HGB 14.0 13.2  HCT 41.2 41.2  MCV 111.4* 113.8*  PLT 144* 130*    Basic Metabolic Panel: Recent Labs  Lab 09/05/18 0704  NA 132*  K 4.0  CL 96*  CO2 20*  GLUCOSE 108*  BUN 13  CREATININE  1.19  CALCIUM 8.5*   GFR: Estimated Creatinine Clearance: 92 mL/min (by C-G formula based on SCr of 1.19 mg/dL). Recent Labs  Lab 09/05/18 0704 09/06/18 0250  WBC 13.2* 13.1*    Liver Function Tests: No results for input(s): AST, ALT, ALKPHOS, BILITOT, PROT, ALBUMIN in the last 168 hours. No results for input(s): LIPASE, AMYLASE in the last 168 hours. No results for input(s): AMMONIA in  the last 168 hours.  ABG No results found for: PHART, PCO2ART, PO2ART, HCO3, TCO2, ACIDBASEDEF, O2SAT   Coagulation Profile: No results for input(s): INR, PROTIME in the last 168 hours.  Cardiac Enzymes: Recent Labs  Lab 09/05/18 0704  TROPONINI 0.04*    HbA1C: No results found for: HGBA1C  CBG: No results for input(s): GLUCAP in the last 168 hours.  Review of Systems:   Review of Systems  Constitutional: Negative.   HENT: Negative.   Eyes: Negative.   Respiratory: Negative.  Negative for cough.   Cardiovascular: Positive for chest pain. Negative for orthopnea.  Gastrointestinal: Negative.   Genitourinary: Negative.   Skin: Negative.   Neurological: Negative.   Endo/Heme/Allergies: Negative.      Past Medical History  He,  has a past medical history of GERD (gastroesophageal reflux disease), Heart murmur, Hodgkin's disease ([place]), Hypertension, Lymphoma ([place]), Sleep apnea, and Thyroid disease.   Surgical History    Past Surgical History:  Procedure Laterality Date  . CHOLECYSTECTOMY    . spleenectomy    . SPLENECTOMY       Social History   reports that he has never smoked. He has never used smokeless tobacco. He reports current alcohol use. He reports that he does not use drugs.   Family History   His family history is not on file.   Allergies Allergies  Allergen Reactions  . Codeine Hives  . Penicillins Hives    Did it involve swelling of the face/tongue/throat, SOB, or low BP? No Did it involve sudden or severe rash/hives, skin peeling, or any reaction on the inside of your mouth or nose? No Did you need to seek medical attention at a hospital or doctor's office? No When did it last happen?Childhood If all above answers are "NO", may proceed with cephalosporin use.     The patient is critically ill with multiple organ system failure and requires high complexity decision making for assessment and support, frequent evaluation and titration of  therapies, advanced monitoring, review of radiographic studies and interpretation of complex data.    Critical Care Time devoted to patient care services, exclusive of separately billable procedures, described in this note is 30 minutes.   Sherrilyn Rist, MD [place], PCCM Cell: [phone]

## 2018-09-07 ENCOUNTER — Inpatient Hospital Stay (HOSPITAL_COMMUNITY): Payer: PRIVATE HEALTH INSURANCE

## 2018-09-07 DIAGNOSIS — F5101 Primary insomnia: Secondary | ICD-10-CM

## 2018-09-07 DIAGNOSIS — E039 Hypothyroidism, unspecified: Secondary | ICD-10-CM

## 2018-09-07 DIAGNOSIS — I2699 Other pulmonary embolism without acute cor pulmonale: Secondary | ICD-10-CM

## 2018-09-07 DIAGNOSIS — F320 Major depressive disorder, single episode, mild: Secondary | ICD-10-CM

## 2018-09-07 DIAGNOSIS — I2601 Septic pulmonary embolism with acute cor pulmonale: Secondary | ICD-10-CM

## 2018-09-07 LAB — HEPARIN LEVEL (UNFRACTIONATED): Heparin Unfractionated: 0.5 IU/mL (ref 0.30–0.70)

## 2018-09-07 LAB — CBC
HCT: 39 % (ref 39.0–52.0)
Hemoglobin: 12.3 g/dL — ABNORMAL LOW (ref 13.0–17.0)
MCH: 36.1 pg — ABNORMAL HIGH (ref 26.0–34.0)
MCHC: 31.5 g/dL (ref 30.0–36.0)
MCV: 114.4 fL — ABNORMAL HIGH (ref 80.0–100.0)
Platelets: 201 10*3/uL (ref 150–400)
RBC: 3.41 MIL/uL — ABNORMAL LOW (ref 4.22–5.81)
RDW: 15 % (ref 11.5–15.5)
WBC: 13.5 10*3/uL — ABNORMAL HIGH (ref 4.0–10.5)
nRBC: 1 % — ABNORMAL HIGH (ref 0.0–0.2)

## 2018-09-07 MED ORDER — ALUM & MAG HYDROXIDE-SIMETH 200-200-20 MG/5ML PO SUSP: 30.0000 mL | ORAL | Status: DC | PRN

## 2018-09-07 MED ORDER — HYDROCORTISONE (PERIANAL) 2.5 % EX CREA: 1.0000 "application " | TOPICAL_CREAM | Freq: Four times a day (QID) | CUTANEOUS | Status: DC | PRN

## 2018-09-07 MED ORDER — SENNOSIDES-DOCUSATE SODIUM 8.6-50 MG PO TABS: 2.0000 | ORAL_TABLET | Freq: Every evening | ORAL | Status: DC | PRN

## 2018-09-07 MED ORDER — SODIUM CHLORIDE 0.9 % IV SOLN
INTRAVENOUS | Status: AC
  Administered 2018-09-07: 14:00:00 via INTRAVENOUS

## 2018-09-07 MED ORDER — SALINE SPRAY 0.65 % NA SOLN: 1.0000 | NASAL | Status: DC | PRN

## 2018-09-07 MED ORDER — POLYETHYLENE GLYCOL 3350 17 G PO PACK: 17.0000 g | PACK | Freq: Every day | ORAL | Status: DC | PRN

## 2018-09-07 MED ORDER — OXYCODONE HCL 5 MG PO TABS
5.0000 mg | ORAL_TABLET | ORAL | Status: DC | PRN
  Administered 2018-09-07 – 2018-09-09 (×6): 5 mg via ORAL
  Filled 2018-09-07 (×6): qty 1

## 2018-09-07 MED ORDER — HYDROCORTISONE 1 % EX CREA: 1.0000 "application " | TOPICAL_CREAM | Freq: Three times a day (TID) | CUTANEOUS | Status: DC | PRN

## 2018-09-07 MED ORDER — POLYVINYL ALCOHOL 1.4 % OP SOLN: 1.0000 [drp] | OPHTHALMIC | Status: DC | PRN

## 2018-09-07 MED ORDER — MUSCLE RUB 10-15 % EX CREA: 1.0000 "application " | TOPICAL_CREAM | CUTANEOUS | Status: DC | PRN

## 2018-09-07 MED ORDER — LORATADINE 10 MG PO TABS: 10.0000 mg | ORAL_TABLET | Freq: Every day | ORAL | Status: DC | PRN

## 2018-09-07 MED ORDER — METOCLOPRAMIDE HCL 5 MG/ML IJ SOLN
5.0000 mg | Freq: Four times a day (QID) | INTRAMUSCULAR | Status: DC | PRN
  Administered 2018-09-07 – 2018-09-08 (×3): 5 mg via INTRAVENOUS
  Filled 2018-09-07 (×3): qty 2

## 2018-09-07 MED ORDER — HYDRALAZINE HCL 20 MG/ML IJ SOLN: 10.0000 mg | INTRAMUSCULAR | Status: DC | PRN

## 2018-09-07 MED ORDER — QUETIAPINE FUMARATE 25 MG PO TABS
25.0000 mg | ORAL_TABLET | Freq: Every day | ORAL | Status: DC
  Administered 2018-09-07 – 2018-09-08 (×2): 25 mg via ORAL
  Filled 2018-09-07 (×2): qty 1

## 2018-09-07 MED ORDER — PHENOL 1.4 % MT LIQD: 1.0000 | OROMUCOSAL | Status: DC | PRN

## 2018-09-07 MED ORDER — LIP MEDEX EX OINT: 1.0000 "application " | TOPICAL_OINTMENT | CUTANEOUS | Status: DC | PRN

## 2018-09-07 MED ORDER — ACETAMINOPHEN 325 MG PO TABS
650.0000 mg | ORAL_TABLET | Freq: Four times a day (QID) | ORAL | Status: DC | PRN
  Administered 2018-09-08 – 2018-09-09 (×2): 650 mg via ORAL
  Filled 2018-09-07 (×2): qty 2

## 2018-09-07 MED ORDER — TRAZODONE HCL 50 MG PO TABS
50.0000 mg | ORAL_TABLET | Freq: Every evening | ORAL | Status: DC | PRN
  Administered 2018-09-07 – 2018-09-08 (×2): 50 mg via ORAL
  Filled 2018-09-07 (×2): qty 1

## 2018-09-07 NOTE — Progress Notes (Signed)
[place] for heparin Indication: confirmed PE  Allergies  Allergen Reactions  . Codeine Hives  . Penicillins Hives    Did it involve swelling of the face/tongue/throat, SOB, or low BP? No Did it involve sudden or severe rash/hives, skin peeling, or any reaction on the inside of your mouth or nose? No Did you need to seek medical attention at a hospital or doctor's office? No When did it last happen?Childhood If all above answers are "NO", may proceed with cephalosporin use.     Patient Measurements: Height: 5\' 11"  (180.3 cm) Weight: 233 lb 11 oz (106 kg) IBW/kg (Calculated) : 75.3 Heparin Dosing Weight: 99 kg  Vital Signs: Temp: 98 F (36.7 C) (06/25 0326) Temp Source: Oral (06/25 0326) BP: 183/120 (06/25 0900) Pulse Rate: 95 (06/25 0900)  Labs: Recent Labs    09/05/18 0704  09/05/18 2254 09/06/18 0250 09/07/18 0242  HGB 14.0  --   --  13.2 12.3*  HCT 41.2  --   --  41.2 39.0  PLT 144*  --   --  130* 201  HEPARINUNFRC  --    < > 0.52 0.50 0.50  CREATININE 1.19  --   --   --   --   TROPONINI 0.04*  --   --   --   --    < > = values in this interval not displayed.    Estimated Creatinine Clearance: 92 mL/min (by C-G formula based on SCr of 1.19 mg/dL).   Assessment: Pt is a 13 YOM presenting w/ cc of SOB. COVID-19 test neg. D-dimer was found to be elevated; CTA confirms extensive bilateral PE w/ RH strain. 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

## 2018-09-07 NOTE — Progress Notes (Signed)
PROGRESS NOTE    Bradley Campos  NLG:[id number] DOB: 21-Feb-1966 DOA: 09/05/2018 PCP: System, Provider Not In   Brief Narrative:  53 year old with history of Hodgkin's disease, essential hypertension, hypothyroidism came to the hospital complains of atypical chest pain shortness of breath.  He was found to have bilateral submassive pulmonary embolism with cor pulmonale.  Echocardiogram showed strain on the right side of the heart with preserved ejection fraction.  Was started on heparin drip and taken to the ICU for closer monitoring.   Assessment & Plan:   Active Problems:   Pulmonary embolism (HCC)  Acute bilateral pulmonary embolism, submassive with cor pulmonale - Currently hemodynamically stable.  Continue heparin drip, plans to transition patient to Eliquis over next 24 hours.  I have requested consultation from case management to provide with cost. -Echocardiogram showed preserved ejection fraction with strain on the right side of the heart.  May need limited repeat echocardiogram in 4 weeks. - Continue heparin drip  -Lower extremity Dopplers-pending  Hypothyroidism -Continue Synthroid  Essential hypertension -Avoiding any antihypertensives  Insomnia and feels depressed -We will start trazodone at bedtime as needed.  Seroquel 25 mg at bedtime daily.  I have extensively discussed with him different types of anticoagulation-there are risks and benefit.  Patient chooses.  Go with Eliquis.  DVT prophylaxis: Heparin drip Code Status: Full code Family Communication: None at bedside Disposition Plan: Maintain hospital stay for at least 24-48 hours to ensure he remains hemodynamically stable while on heparin drip.  Unsafe for discharge today given extent of his clots.  Consultants:   None  Procedures:   None  Antimicrobials:   None   Subjective: Feels anxious about his condition.  Complaining of trouble sleeping at night and also overall feeling down depressed. Denies  to me any recent surgery, trauma or family history of clots.  He does have previous history of Hodgkin's lymphoma.  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  Review of Systems Otherwise negative except as per HPI, including: General = no fevers, chills, dizziness, malaise, fatigue HEENT/EYES = negative for pain, redness, loss of vision, double vision, blurred vision, loss of hearing, sore throat, hoarseness, dysphagia Cardiovascular= negative for chest pain, palpitation, murmurs, lower extremity swelling Respiratory/lungs= negative for shortness of breath, cough, hemoptysis, wheezing, mucus production Gastrointestinal= negative for nausea, vomiting,, abdominal pain, melena, hematemesis Genitourinary= negative for Dysuria, Hematuria, Change in Urinary Frequency MSK = Negative for arthralgia, myalgias, Back Pain, Joint swelling  Neurology= Negative for headache, seizures, numbness, tingling  Psychiatry= Negative for anxiety, depression, suicidal and homocidal ideation Allergy/Immunology= Medication/Food allergy as listed  Skin= Negative for Rash, lesions, ulcers, itching   Objective: Vitals:   09/07/18 0800 09/07/18 0900 09/07/18 1000 09/07/18 1200  BP: (!) 145/90 (!) 183/120 (!) 155/78 (!) 166/93  Pulse: 80 95 81 82  Resp: 15 15 17  (!) 22  Temp: 98.3 F (36.8 C)   98.3 F (36.8 C)  TempSrc: Oral   Oral  SpO2: 95% 96% 93% 98%  Weight:      Height:        Intake/Output Summary (Last 24 hours) at 09/07/2018 1249 Last data filed at 09/07/2018 0600 Gross per 24 hour  Intake 1248.71 ml  Output 2175 ml  Net -926.29 ml   Filed Weights   09/05/18 0649 09/05/18 1644 09/06/18 0500  Weight: 106.1 kg 107.7 kg 106 kg    Examination:  General exam: Appears calm and comfortable ; on 2l [place] Respiratory system: Clear to auscultation.  Respiratory effort normal. Cardiovascular system: S1 & S2 heard, RRR. No JVD, murmurs, rubs, gallops or clicks. No pedal edema. Gastrointestinal  system: Abdomen is nondistended, soft and nontender. No organomegaly or masses felt. Normal bowel sounds heard. Central nervous system: Alert and oriented. No focal neurological deficits. Extremities: Symmetric 5 x 5 power. Skin: No rashes, lesions or ulcers Psychiatry: Judgement and insight appear normal. Mood & affect appropriate.     Data Reviewed:   CBC: Recent Labs  Lab 09/05/18 0704 09/06/18 0250 09/07/18 0242  WBC 13.2* 13.1* 13.5*  NEUTROABS 10.6*  --   --   HGB 14.0 13.2 12.3*  HCT 41.2 41.2 39.0  MCV 111.4* 113.8* 114.4*  PLT 144* 130* 169   Basic Metabolic Panel: Recent Labs  Lab 09/05/18 0704  NA 132*  K 4.0  CL 96*  CO2 20*  GLUCOSE 108*  BUN 13  CREATININE 1.19  CALCIUM 8.5*   GFR: Estimated Creatinine Clearance: 92 mL/min (by C-G formula based on SCr of 1.19 mg/dL). Liver Function Tests: No results for input(s): AST, ALT, ALKPHOS, BILITOT, PROT, ALBUMIN in the last 168 hours. No results for input(s): LIPASE, AMYLASE in the last 168 hours. No results for input(s): AMMONIA in the last 168 hours. Coagulation Profile: No results for input(s): INR, PROTIME in the last 168 hours. Cardiac Enzymes: Recent Labs  Lab 09/05/18 0704  TROPONINI 0.04*   BNP (last 3 results) No results for input(s): PROBNP in the last 8760 hours. HbA1C: No results for input(s): HGBA1C in the last 72 hours. CBG: No results for input(s): GLUCAP in the last 168 hours. Lipid Profile: No results for input(s): CHOL, HDL, LDLCALC, TRIG, CHOLHDL, LDLDIRECT in the last 72 hours. Thyroid Function Tests: No results for input(s): TSH, T4TOTAL, FREET4, T3FREE, THYROIDAB in the last 72 hours. Anemia Panel: No results for input(s): VITAMINB12, FOLATE, FERRITIN, TIBC, IRON, RETICCTPCT in the last 72 hours. Sepsis Labs: No results for input(s): PROCALCITON, LATICACIDVEN in the last 168 hours.  Recent Results (from the past 240 hour(s))  SARS Coronavirus 2 (CEPHEID - Performed in [place] hospital lab), Hosp Order     Status: None   Collection Time: 09/05/18  8:08 AM   Specimen: Nasopharyngeal Swab  Result Value Ref Range Status   SARS Coronavirus 2 NEGATIVE NEGATIVE Final    Comment: (NOTE) If result is NEGATIVE SARS-CoV-2 target nucleic acids are NOT DETECTED. The SARS-CoV-2 RNA is generally detectable in upper and lower  respiratory specimens during the acute phase of infection. The lowest  concentration of SARS-CoV-2 viral copies this assay can detect is 250  copies / mL. A negative result does not preclude SARS-CoV-2 infection  and should not be used as the sole basis for treatment or other  patient management decisions.  A negative result may occur with  improper specimen collection / handling, submission of specimen other  than nasopharyngeal swab, presence of viral mutation(s) within the  areas targeted by this assay, and inadequate number of viral copies  (<250 copies / mL). A negative result must be combined with clinical  observations, patient history, and epidemiological information. If result is POSITIVE SARS-CoV-2 target nucleic acids are DETECTED. The SARS-CoV-2 RNA is generally detectable in upper and lower  respiratory specimens dur ing the acute phase of infection.  Positive  results are indicative of active infection with SARS-CoV-2.  Clinical  correlation with patient history and other diagnostic information is  necessary to determine patient infection status.  Positive results do  not  rule out bacterial infection or co-infection with other viruses. If result is PRESUMPTIVE POSTIVE SARS-CoV-2 nucleic acids MAY BE PRESENT.   A presumptive positive result was obtained on the submitted specimen  and confirmed on repeat testing.  While 2019 novel coronavirus  (SARS-CoV-2) nucleic acids may be present in the submitted sample  additional confirmatory testing may be necessary for epidemiological  and / or clinical management purposes  to  differentiate between  SARS-CoV-2 and other Sarbecovirus currently known to infect humans.  If clinically indicated additional testing with an alternate test  methodology [phone]) is advised. The SARS-CoV-2 RNA is generally  detectable in upper and lower respiratory sp ecimens during the acute  phase of infection. The expected result is Negative. Fact Sheet for Patients:  StrictlyIdeas.no Fact Sheet for Healthcare Providers: BankingDealers.co.za This test is not yet approved or cleared by the [place] FDA and has been authorized for detection and/or diagnosis of SARS-CoV-2 by FDA under an Emergency Use Authorization (EUA).  This EUA will remain in effect (meaning this test can be used) for the duration of the COVID-19 declaration under Section 564(b)(1) of the Act, 21 U.S.C. section 360bbb-3(b)(1), unless the authorization is terminated or revoked sooner. Performed at [facility], [place] [address], [place], [place] [address]   MRSA PCR Screening     Status: None   Collection Time: 09/05/18  4:42 PM   Specimen: Nasal Mucosa; Nasopharyngeal  Result Value Ref Range Status   MRSA by PCR NEGATIVE NEGATIVE Final    Comment:        The GeneXpert MRSA Assay (FDA approved for NASAL specimens only), is one component of a comprehensive MRSA colonization surveillance program. It is not intended to diagnose MRSA infection nor to guide or monitor treatment for MRSA infections. Performed at [facility], [place] [address] [place], [place] [address]          Radiology Studies: Vas [place] Lower Extremity Venous (dvt)  Result Date: 09/07/2018  Lower Venous Study Indications: SOB, pulmonary embolism, and Right heart strain indicated by CT Angio.  Performing Technologist: Toma Copier RVS  Examination Guidelines: A complete evaluation includes B-mode imaging, spectral Doppler, color Doppler, and power  Doppler as needed of all accessible portions of each vessel. Bilateral testing is considered an integral part of a complete examination. Limited examinations for reoccurring indications may be performed as noted.  +---------+---------------+---------+-----------+----------+-------------------+ RIGHT    CompressibilityPhasicitySpontaneityPropertiesSummary             +---------+---------------+---------+-----------+----------+-------------------+ CFV      Full           Yes      Yes                                      +---------+---------------+---------+-----------+----------+-------------------+ SFJ      Full                                                             +---------+---------------+---------+-----------+----------+-------------------+ FV Prox  Full           Yes      Yes                                      +---------+---------------+---------+-----------+----------+-------------------+  FV Mid   Full                                                             +---------+---------------+---------+-----------+----------+-------------------+ FV DistalFull           Yes      Yes                                      +---------+---------------+---------+-----------+----------+-------------------+ PFV      Full           Yes      Yes                                      +---------+---------------+---------+-----------+----------+-------------------+ POP      Full           Yes      Yes                                      +---------+---------------+---------+-----------+----------+-------------------+ PTV      Full                                                             +---------+---------------+---------+-----------+----------+-------------------+ PERO     Full                                         Difficult to image                                                        due to muscularture                                                        of the calf         +---------+---------------+---------+-----------+----------+-------------------+   +---------+---------------+---------+-----------+----------+-------------------+ LEFT     CompressibilityPhasicitySpontaneityPropertiesSummary             +---------+---------------+---------+-----------+----------+-------------------+ CFV      Full           Yes      Yes                                      +---------+---------------+---------+-----------+----------+-------------------+ SFJ      Full                                                             +---------+---------------+---------+-----------+----------+-------------------+  FV Prox  Full           Yes      Yes                                      +---------+---------------+---------+-----------+----------+-------------------+ FV Mid   Full                                                             +---------+---------------+---------+-----------+----------+-------------------+ FV DistalFull           Yes      Yes                                      +---------+---------------+---------+-----------+----------+-------------------+ PFV      Full           Yes      Yes                                      +---------+---------------+---------+-----------+----------+-------------------+ POP      Full           Yes      Yes                                      +---------+---------------+---------+-----------+----------+-------------------+ PTV      Full                                                             +---------+---------------+---------+-----------+----------+-------------------+ PERO     Full                                         Difficult to image                                                        due to the                                                                muscularture of the  calf                +---------+---------------+---------+-----------+----------+-------------------+     Summary: Right: There is no evidence of deep vein thrombosis in the lower extremity. No cystic structure found in the popliteal fossa. Left: There is no evidence of deep vein thrombosis in the lower extremity. No cystic structure found in the popliteal fossa.  *See table(s) above for measurements and observations.    Preliminary         Scheduled Meds: . atorvastatin  80 mg Oral q1800  . Chlorhexidine Gluconate Cloth  6 each Topical Daily  . folic acid  1 mg Oral Daily  . levothyroxine  325 mcg Oral Q0600  . losartan  100 mg Oral QHS  . pantoprazole  40 mg Oral Daily   Continuous Infusions: . heparin 1,900 Units/hr (09/06/18 2352)     LOS: 2 days   Time spent= 35 mins    Ankit Arsenio Loader, MD [facility]  If 7PM-7AM, please contact night-coverage [email or web address] 09/07/2018, 12:49 PM

## 2018-09-07 NOTE — Progress Notes (Addendum)
Bilateral lower extremity venous duplex completed. Preliminary results pending in Chart review CV Proc. Report given to the RN [facility], [place] 09/07/2018 9:00 am

## 2018-09-07 NOTE — Discharge Instructions (Signed)
Pulmonary Embolism    A pulmonary embolism (PE) is a sudden blockage or decrease of blood flow in one lung or both lungs. Most blockages come from a blood clot that forms in a lower leg, thigh, or arm vein (deep vein thrombosis, DVT) and travels to the lungs. A clot is blood that has thickened into a gel or solid. PE is a dangerous and life-threatening condition that needs to be treated right away.  What are the causes?  This condition is usually caused by a blood clot that forms in a vein and moves to the lungs. In rare cases, it may be caused by air, fat, part of a tumor, or other tissue that moves through the veins and into the lungs.  What increases the risk?  The following factors may make you more likely to develop this condition:  · Traumatic injury, such as breaking a hip or leg.  · Spinal cord injury.  · Orthopedic surgery, especially hip or knee replacement.  · Any major surgery.  · Stroke.  · Having DVT.  · Blood clots or blood clotting disease.  · Long-term (chronic) lung or heart disease.  · Taking medicines that contain estrogen. These include birth control pills and hormone replacement therapy.  · Cancer and chemotherapy.  · Having a central venous catheter.  · Pregnancy and the period of time after delivery (postpartum).  · Being older than age 60.  · Being overweight.  · Smoking.  What are the signs or symptoms?  Symptoms of this condition usually start suddenly and include:  · Shortness of breath during activity or at rest.  · Coughing or coughing up blood or blood-tinged mucus.  · Chest pain that is often worse with deep breaths.  · Rapid or irregular heartbeat.  · Feeling light-headed or dizzy.  · Fainting.  · Feeling anxious.  · Fever.  · Sweating.  · Pain and swelling in a leg. This is a symptom of DVT, which can lead to PE.  How is this diagnosed?  This condition may be diagnosed based on:  · Your medical history.  · A physical exam.  · Blood tests.  · CT pulmonary angiogram. This test checks  blood flow in and around your lungs.  · Ventilation-perfusion scan, also called a lung VQ scan. This test measures air flow and blood flow to the lungs.  · Ultrasound of the legs.  How is this treated?  Treatment for this condition depends on many factors, such as the cause of your PE, your risk for bleeding or developing more clots, and other medical conditions you have. Treatment aims to remove, dissolve, or stop blood clots from forming or growing larger. Treatment may include:  · Medicines, such as:  ? Blood thinning medicines (anticoagulants) to stop clots from forming or growing.  ? Medicines that dissolve clots (thrombolytics).  · Procedures, such as:  ? Using a flexible tube to remove a blood clot (embolectomy) or deliver medicine to destroy it (catheter-directed thrombolysis).  ? Inserting a filter into a large vein that carries blood to the heart (inferior vena cava). This filter (vena cava filter) catches blood clots before they reach the lungs.  ? Surgery to remove the clot (surgical embolectomy). This is rare.  You may need a combination of immediate, long-term (up to 3 months after diagnosis), and extended (more than 3 months after diagnosis) treatments. Your treatment may continue for several months (maintenance therapy). You and your health care provider will work together   to choose the treatment program that is best for you.  Follow these instructions at home:  Medicines  · Take over-the-counter and prescription medicines only as told by your health care provider.  · If you are taking an anticoagulant medicine:  ? Take the medicine every day at the same time each day.  ? Understand what foods and drugs interact with your medicine.  ? Understand the side effects of this medicine, including excessive bruising or bleeding. Ask your health care provider or pharmacist about other side effects.  General instructions  · Wear a medical alert bracelet or carry a medical alert card that says you have had a PE  and lists what medicines you take.  · Ask your health care provider when you may return to your normal activities. Avoid sitting or lying for a long time without moving.  · Maintain a healthy weight. Ask your health care provider what weight is healthy for you.  · Do not use any products that contain nicotine or tobacco, such as cigarettes and e-cigarettes. If you need help quitting, ask your health care provider.  · Talk with your health care provider about any travel plans. It is important to make sure that you are still able to take your medicine while on trips.  · Keep all follow-up visits as told by your health care provider. This is important.  Contact a health care provider if:  · You missed a dose of your blood thinner medicine.  Get help right away if:  · You have:  ? New or increased pain, swelling, warmth, or redness in an arm or leg.  ? Numbness or tingling in an arm or leg.  ? Shortness of breath during activity or at rest.  ? A fever.  ? Chest pain.  ? A rapid or irregular heartbeat.  ? A severe headache.  ? Vision changes.  ? A serious fall or accident, or you hit your head.  ? Stomach (abdominal) pain.  ? Blood in your vomit, stool, or urine.  ? A cut that will not stop bleeding.  · You cough up blood.  · You feel light-headed or dizzy.  · You cannot move your arms or legs.  · You are confused or have memory loss.  These symptoms may represent a serious problem that is an emergency. Do not wait to see if the symptoms will go away. Get medical help right away. Call your local emergency services (911 in the [place] Do not drive yourself to the hospital.  Summary  · A pulmonary embolism (PE) is a sudden blockage or decrease of blood flow in one lung or both lungs. PE is a dangerous and life-threatening condition that needs to be treated right away.  · Treatments for this condition usually include medicines to thin your blood (anticoagulants) or medicines to break apart blood clots (thrombolytics).  · If  you are given blood thinners, it is important to take the medicine every single day at the same time each day.  · If you have signs of PE or DVT, call your local emergency services (911 in the [place]  This information is not intended to replace advice given to you by your health care provider. Make sure you discuss any questions you have with your health care provider.  Document Released: 02/27/2000 Document Revised: 10/14/2017 Document Reviewed: 04/14/2017  Elsevier Interactive Patient Education © 2019 [employer]

## 2018-09-07 NOTE — Progress Notes (Signed)
Pt concerned about going home.  He is currently trying to move to [place].  Has gotten a new job here in last couple weeks.  Only knows one person in [place].

## 2018-09-08 DIAGNOSIS — I2609 Other pulmonary embolism with acute cor pulmonale: Principal | ICD-10-CM

## 2018-09-08 DIAGNOSIS — F5102 Adjustment insomnia: Secondary | ICD-10-CM

## 2018-09-08 DIAGNOSIS — F419 Anxiety disorder, unspecified: Secondary | ICD-10-CM

## 2018-09-08 LAB — BASIC METABOLIC PANEL
Anion gap: 7 (ref 5–15)
BUN: 6 mg/dL (ref 6–20)
CO2: 23 mmol/L (ref 22–32)
Calcium: 8.2 mg/dL — ABNORMAL LOW (ref 8.9–10.3)
Chloride: 107 mmol/L (ref 98–111)
Creatinine, Ser: 0.85 mg/dL (ref 0.61–1.24)
GFR calc Af Amer: >60 mL/min (ref >60)
GFR calc non Af Amer: >60 mL/min (ref >60)
Glucose, Bld: 97 mg/dL (ref 70–99)
Potassium: 4 mmol/L (ref 3.5–5.1)
Sodium: 137 mmol/L (ref 135–145)

## 2018-09-08 LAB — CBC
HCT: 38.1 % — ABNORMAL LOW (ref 39.0–52.0)
Hemoglobin: 12.5 g/dL — ABNORMAL LOW (ref 13.0–17.0)
MCH: 36.8 pg — ABNORMAL HIGH (ref 26.0–34.0)
MCHC: 32.8 g/dL (ref 30.0–36.0)
MCV: 112.1 fL — ABNORMAL HIGH (ref 80.0–100.0)
Platelets: 210 10*3/uL (ref 150–400)
RBC: 3.4 MIL/uL — ABNORMAL LOW (ref 4.22–5.81)
RDW: 14.8 % (ref 11.5–15.5)
WBC: 13 10*3/uL — ABNORMAL HIGH (ref 4.0–10.5)
nRBC: 1.1 % — ABNORMAL HIGH (ref 0.0–0.2)

## 2018-09-08 LAB — HEPARIN LEVEL (UNFRACTIONATED): Heparin Unfractionated: 0.52 IU/mL (ref 0.30–0.70)

## 2018-09-08 LAB — MAGNESIUM: Magnesium: 1.9 mg/dL (ref 1.7–2.4)

## 2018-09-08 MED ORDER — RIVAROXABAN 15 MG PO TABS
15.0000 mg | ORAL_TABLET | Freq: Two times a day (BID) | ORAL | Status: DC
  Administered 2018-09-08 – 2018-09-09 (×2): 15 mg via ORAL
  Filled 2018-09-08 (×2): qty 1

## 2018-09-08 MED ORDER — RIVAROXABAN 15 MG PO TABS
15.0000 mg | ORAL_TABLET | Freq: Once | ORAL | Status: AC
  Administered 2018-09-08: 14:00:00 15 mg via ORAL
  Filled 2018-09-08: qty 1

## 2018-09-08 NOTE — Progress Notes (Signed)
PROGRESS NOTE    Bradley Campos  PYP:[id number] DOB: 01-22-1966 DOA: 09/05/2018 PCP: System, Provider Not In   Brief Narrative:  53 year old with history of Hodgkin's disease, essential hypertension, hypothyroidism came to the hospital complains of atypical chest pain shortness of breath.  He was found to have bilateral submassive pulmonary embolism with cor pulmonale.  Echocardiogram showed strain on the right side of the heart with preserved ejection fraction.  Was started on heparin drip and taken to the ICU for closer monitoring.   Assessment & Plan:   Active Problems:   Pulmonary embolism (HCC)  Acute bilateral pulmonary embolism, submassive with cor pulmonale - Currently hemodynamically stable.  Continue heparin drip, it appears Eliquis will be costly therefore we will plan on transitioning to Xarelto  -Echocardiogram showed preserved ejection fraction with strain on the right side of the heart.  May need limited repeat echocardiogram in 4 weeks. - Continue heparin drip  -Lower extremity Dopplers-negative.   Hypothyroidism -Continue Synthroid  Essential hypertension -Avoiding any antihypertensives  Insomnia and feels depressed -Continue trazodone at bedtime as needed.  Seroquel 25 mg at bedtime daily.   DVT prophylaxis: Heparin drip, plan to transition to Xarelto Code Status: Full code Family Communication: None at bedside Disposition Plan: We will transfer patient out of the telemetry unit.  Monitor him on oral Xarelto.  Plan is to ambulate him and also watch his oxygen levels and his symptoms.  If stays stable over 24 hours, will consider discharging him tomorrow.  Unsafe for discharge today given his clot burden with cor pulmonale.  Consultants:   None  Procedures:   None  Antimicrobials:   None   Subjective: No complaints today, chest pain is better.  Not sob at rest.   Review of Systems Otherwise negative except as per HPI, including: General = no  fevers, chills, dizziness, malaise, fatigue HEENT/EYES = negative for pain, redness, loss of vision, double vision, blurred vision, loss of hearing, sore throat, hoarseness, dysphagia Cardiovascular= negative for chest pain, palpitation, murmurs, lower extremity swelling Respiratory/lungs= negative for shortness of breath, cough, hemoptysis, wheezing, mucus production Gastrointestinal= negative for nausea, vomiting,, abdominal pain, melena, hematemesis Genitourinary= negative for Dysuria, Hematuria, Change in Urinary Frequency MSK = Negative for arthralgia, myalgias, Back Pain, Joint swelling  Neurology= Negative for headache, seizures, numbness, tingling  Psychiatry= Negative for anxiety, depression, suicidal and homocidal ideation Allergy/Immunology= Medication/Food allergy as listed  Skin= Negative for Rash, lesions, ulcers, itching   Objective: Vitals:   09/08/18 0400 09/08/18 0500 09/08/18 0600 09/08/18 0800  BP: 136/70 135/68 137/77   Pulse: 84 81 83   Resp: 16 13 14    Temp:    97.7 F (36.5 C)  TempSrc:    Oral  SpO2: 95% 94% 95%   Weight:  107.6 kg    Height:        Intake/Output Summary (Last 24 hours) at 09/08/2018 1044 Last data filed at 09/08/2018 0600 Gross per 24 hour  Intake 2019.23 ml  Output 1975 ml  Net 44.23 ml   Filed Weights   09/05/18 1644 09/06/18 0500 09/08/18 0500  Weight: 107.7 kg 106 kg 107.6 kg    Examination:  Constitutional: NAD, calm, comfortable; 2L [place] Eyes: PERRL, lids and conjunctivae normal ENMT: Mucous membranes are moist. Posterior pharynx clear of any exudate or lesions.Normal dentition.  Neck: normal, supple, no masses, no thyromegaly Respiratory: clear to auscultation bilaterally, no wheezing, no crackles. Normal respiratory effort. No accessory muscle use.  Cardiovascular: Regular rate and rhythm, no  murmurs / rubs / gallops. No extremity edema. 2+ pedal pulses. No carotid bruits.  Abdomen: no tenderness, no masses palpated. No  hepatosplenomegaly. Bowel sounds positive.  Musculoskeletal: no clubbing / cyanosis. No joint deformity upper and lower extremities. Good ROM, no contractures. Normal muscle tone.  Skin: no rashes, lesions, ulcers. No induration Neurologic: CN 2-12 grossly intact. Sensation intact, DTR normal. Strength 5/5 in all 4.  Psychiatric: Normal judgment and insight. Alert and oriented x 3. Normal mood.     Data Reviewed:   CBC: Recent Labs  Lab 09/05/18 0704 09/06/18 0250 09/07/18 0242 09/08/18 0254  WBC 13.2* 13.1* 13.5* 13.0*  NEUTROABS 10.6*  --   --   --   HGB 14.0 13.2 12.3* 12.5*  HCT 41.2 41.2 39.0 38.1*  MCV 111.4* 113.8* 114.4* 112.1*  PLT 144* 130* 201 885   Basic Metabolic Panel: Recent Labs  Lab 09/05/18 0704 09/08/18 0254  NA 132* 137  K 4.0 4.0  CL 96* 107  CO2 20* 23  GLUCOSE 108* 97  BUN 13 6  CREATININE 1.19 0.85  CALCIUM 8.5* 8.2*  MG  --  1.9   GFR: Estimated Creatinine Clearance: 129.7 mL/min (by C-G formula based on SCr of 0.85 mg/dL). Liver Function Tests: No results for input(s): AST, ALT, ALKPHOS, BILITOT, PROT, ALBUMIN in the last 168 hours. No results for input(s): LIPASE, AMYLASE in the last 168 hours. No results for input(s): AMMONIA in the last 168 hours. Coagulation Profile: No results for input(s): INR, PROTIME in the last 168 hours. Cardiac Enzymes: Recent Labs  Lab 09/05/18 0704  TROPONINI 0.04*   BNP (last 3 results) No results for input(s): PROBNP in the last 8760 hours. HbA1C: No results for input(s): HGBA1C in the last 72 hours. CBG: No results for input(s): GLUCAP in the last 168 hours. Lipid Profile: No results for input(s): CHOL, HDL, LDLCALC, TRIG, CHOLHDL, LDLDIRECT in the last 72 hours. Thyroid Function Tests: No results for input(s): TSH, T4TOTAL, FREET4, T3FREE, THYROIDAB in the last 72 hours. Anemia Panel: No results for input(s): VITAMINB12, FOLATE, FERRITIN, TIBC, IRON, RETICCTPCT in the last 72 hours. Sepsis  Labs: No results for input(s): PROCALCITON, LATICACIDVEN in the last 168 hours.  Recent Results (from the past 240 hour(s))  SARS Coronavirus 2 (CEPHEID - Performed in [place] hospital lab), Hosp Order     Status: None   Collection Time: 09/05/18  8:08 AM   Specimen: Nasopharyngeal Swab  Result Value Ref Range Status   SARS Coronavirus 2 NEGATIVE NEGATIVE Final    Comment: (NOTE) If result is NEGATIVE SARS-CoV-2 target nucleic acids are NOT DETECTED. The SARS-CoV-2 RNA is generally detectable in upper and lower  respiratory specimens during the acute phase of infection. The lowest  concentration of SARS-CoV-2 viral copies this assay can detect is 250  copies / mL. A negative result does not preclude SARS-CoV-2 infection  and should not be used as the sole basis for treatment or other  patient management decisions.  A negative result may occur with  improper specimen collection / handling, submission of specimen other  than nasopharyngeal swab, presence of viral mutation(s) within the  areas targeted by this assay, and inadequate number of viral copies  (<250 copies / mL). A negative result must be combined with clinical  observations, patient history, and epidemiological information. If result is POSITIVE SARS-CoV-2 target nucleic acids are DETECTED. The SARS-CoV-2 RNA is generally detectable in upper and lower  respiratory specimens dur ing the acute phase  of infection.  Positive  results are indicative of active infection with SARS-CoV-2.  Clinical  correlation with patient history and other diagnostic information is  necessary to determine patient infection status.  Positive results do  not rule out bacterial infection or co-infection with other viruses. If result is PRESUMPTIVE POSTIVE SARS-CoV-2 nucleic acids MAY BE PRESENT.   A presumptive positive result was obtained on the submitted specimen  and confirmed on repeat testing.  While 2019 novel coronavirus  (SARS-CoV-2)  nucleic acids may be present in the submitted sample  additional confirmatory testing may be necessary for epidemiological  and / or clinical management purposes  to differentiate between  SARS-CoV-2 and other Sarbecovirus currently known to infect humans.  If clinically indicated additional testing with an alternate test  methodology [phone]) is advised. The SARS-CoV-2 RNA is generally  detectable in upper and lower respiratory sp ecimens during the acute  phase of infection. The expected result is Negative. Fact Sheet for Patients:  StrictlyIdeas.no Fact Sheet for Healthcare Providers: BankingDealers.co.za This test is not yet approved or cleared by the [place] FDA and has been authorized for detection and/or diagnosis of SARS-CoV-2 by FDA under an Emergency Use Authorization (EUA).  This EUA will remain in effect (meaning this test can be used) for the duration of the COVID-19 declaration under Section 564(b)(1) of the Act, 21 U.S.C. section 360bbb-3(b)(1), unless the authorization is terminated or revoked sooner. Performed at [facility], [address], [place], [place] [address]   MRSA PCR Screening     Status: None   Collection Time: 09/05/18  4:42 PM   Specimen: Nasal Mucosa; Nasopharyngeal  Result Value Ref Range Status   MRSA by PCR NEGATIVE NEGATIVE Final    Comment:        The GeneXpert MRSA Assay (FDA approved for NASAL specimens only), is one component of a comprehensive MRSA colonization surveillance program. It is not intended to diagnose MRSA infection nor to guide or monitor treatment for MRSA infections. Performed at [facility], [address], [place], [place] [address]          Radiology Studies: Vas [place] Lower Extremity Venous (dvt)  Result Date: 09/07/2018  Lower Venous Study Indications: SOB, pulmonary embolism, and Right heart strain indicated by CT  Angio.  Performing Technologist: Toma Copier RVS  Examination Guidelines: A complete evaluation includes B-mode imaging, spectral Doppler, color Doppler, and power Doppler as needed of all accessible portions of each vessel. Bilateral testing is considered an integral part of a complete examination. Limited examinations for reoccurring indications may be performed as noted.  +---------+---------------+---------+-----------+----------+-------------------+  RIGHT     Compressibility Phasicity Spontaneity Properties Summary              +---------+---------------+---------+-----------+----------+-------------------+  CFV       Full            Yes       Yes                                         +---------+---------------+---------+-----------+----------+-------------------+  SFJ       Full                                                                  +---------+---------------+---------+-----------+----------+-------------------+  FV Prox   Full            Yes       Yes                                         +---------+---------------+---------+-----------+----------+-------------------+  FV Mid    Full                                                                  +---------+---------------+---------+-----------+----------+-------------------+  FV Distal Full            Yes       Yes                                         +---------+---------------+---------+-----------+----------+-------------------+  PFV       Full            Yes       Yes                                         +---------+---------------+---------+-----------+----------+-------------------+  POP       Full            Yes       Yes                                         +---------+---------------+---------+-----------+----------+-------------------+  PTV       Full                                                                  +---------+---------------+---------+-----------+----------+-------------------+  PERO      Full                                              Difficult to image                                                               due to muscularture                                                              of the calf          +---------+---------------+---------+-----------+----------+-------------------+   +---------+---------------+---------+-----------+----------+-------------------+  LEFT      Compressibility Phasicity Spontaneity Properties Summary              +---------+---------------+---------+-----------+----------+-------------------+  CFV       Full            Yes       Yes                                         +---------+---------------+---------+-----------+----------+-------------------+  SFJ       Full                                                                  +---------+---------------+---------+-----------+----------+-------------------+  FV Prox   Full            Yes       Yes                                         +---------+---------------+---------+-----------+----------+-------------------+  FV Mid    Full                                                                  +---------+---------------+---------+-----------+----------+-------------------+  FV Distal Full            Yes       Yes                                         +---------+---------------+---------+-----------+----------+-------------------+  PFV       Full            Yes       Yes                                         +---------+---------------+---------+-----------+----------+-------------------+  POP       Full            Yes       Yes                                         +---------+---------------+---------+-----------+----------+-------------------+  PTV       Full                                                                  +---------+---------------+---------+-----------+----------+-------------------+  PERO      Full  Difficult to image                                                                due to the                                                                       muscularture of the                                                              calf                 +---------+---------------+---------+-----------+----------+-------------------+     Summary: Right: There is no evidence of deep vein thrombosis in the lower extremity. No cystic structure found in the popliteal fossa. Left: There is no evidence of deep vein thrombosis in the lower extremity. No cystic structure found in the popliteal fossa.  *See table(s) above for measurements and observations. Electronically signed by Servando Snare MD on 09/07/2018 at 1:19:39 PM.    Final         Scheduled Meds:  atorvastatin  80 mg Oral q1800   Chlorhexidine Gluconate Cloth  6 each Topical Daily   folic acid  1 mg Oral Daily   levothyroxine  325 mcg Oral Q0600   losartan  100 mg Oral QHS   pantoprazole  40 mg Oral Daily   QUEtiapine  25 mg Oral QHS   Continuous Infusions:  sodium chloride 100 mL/hr at 09/08/18 0600   heparin 1,900 Units/hr (09/08/18 0552)     LOS: 3 days   Time spent= 35 mins    Kaylon Laroche Arsenio Loader, MD [facility]  If 7PM-7AM, please contact night-coverage [email or web address] 09/08/2018, 10:44 AM

## 2018-09-08 NOTE — Progress Notes (Signed)
[place] for heparin >> Xarelto Indication: confirmed PE  Allergies  Allergen Reactions  . Codeine Hives  . Penicillins Hives    Did it involve swelling of the face/tongue/throat, SOB, or low BP? No Did it involve sudden or severe rash/hives, skin peeling, or any reaction on the inside of your mouth or nose? No Did you need to seek medical attention at a hospital or doctor's office? No When did it last happen?Childhood If all above answers are "NO", may proceed with cephalosporin use.     Patient Measurements: Height: 5\' 11"  (180.3 cm) Weight: 237 lb 3.4 oz (107.6 kg) IBW/kg (Calculated) : 75.3 Heparin Dosing Weight: 99 kg  Vital Signs: Temp: 97.7 F (36.5 C) (06/26 0800) Temp Source: Oral (06/26 0800) BP: 145/83 (06/26 1100) Pulse Rate: 86 (06/26 1100)  Labs: Recent Labs    09/06/18 0250 09/07/18 0242 09/08/18 0254  HGB 13.2 12.3* 12.5*  HCT 41.2 39.0 38.1*  PLT 130* 201 210  HEPARINUNFRC 0.50 0.50 0.52  CREATININE  --   --  0.85    Estimated Creatinine Clearance: 129.7 mL/min (by C-G formula based on SCr of 0.85 mg/dL).   Assessment: Pt is a 57 YOM presenting w/ cc of SOB. COVID-19 test neg. D-dimer was found to be elevated; CTA confirms extensive bilateral PE w/ RH strain. 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

## 2018-09-08 NOTE — TOC Benefit Eligibility Note (Signed)
Transition of Care [facility]) Benefit Eligibility Note    Patient Details  Name: Maximos Zayas MRN: [id number] Date of Birth: 1966-03-07   Medication/Dose: 10 mg Bid  Covered?: Yes  Tier: 2 Drug  Prescription Coverage Preferred Pharmacy: [facility] is in network he's used this pharmacy before according to April  Spoke with Person/Company/Phone Number:: Jonathan/ [employer] [phone] and April/ [employer] [phone]  Co-Pay: Eliquis 10 mg Bid 30day supply $467.11 and Xarelto $40.00 and Rivaroxaban $40.00  Prior Approval: No  Deductible: Met  Additional Notes: if he uses an out of network pharmacy cost is $125.00 for Xarelto and Rivaroxaban    Kerin Salen Phone Number: 09/08/2018, 10:30 AM

## 2018-09-09 DIAGNOSIS — F411 Generalized anxiety disorder: Secondary | ICD-10-CM

## 2018-09-09 LAB — CBC
HCT: 37.5 % — ABNORMAL LOW (ref 39.0–52.0)
Hemoglobin: 12.3 g/dL — ABNORMAL LOW (ref 13.0–17.0)
MCH: 37.3 pg — ABNORMAL HIGH (ref 26.0–34.0)
MCHC: 32.8 g/dL (ref 30.0–36.0)
MCV: 113.6 fL — ABNORMAL HIGH (ref 80.0–100.0)
Platelets: 261 10*3/uL (ref 150–400)
RBC: 3.3 MIL/uL — ABNORMAL LOW (ref 4.22–5.81)
RDW: 14.8 % (ref 11.5–15.5)
WBC: 12.4 10*3/uL — ABNORMAL HIGH (ref 4.0–10.5)
nRBC: 0.7 % — ABNORMAL HIGH (ref 0.0–0.2)

## 2018-09-09 LAB — BASIC METABOLIC PANEL
Anion gap: 12 (ref 5–15)
BUN: <5 mg/dL — ABNORMAL LOW (ref 6–20)
CO2: 20 mmol/L — ABNORMAL LOW (ref 22–32)
Calcium: 8.3 mg/dL — ABNORMAL LOW (ref 8.9–10.3)
Chloride: 105 mmol/L (ref 98–111)
Creatinine, Ser: 0.8 mg/dL (ref 0.61–1.24)
GFR calc Af Amer: >60 mL/min (ref >60)
GFR calc non Af Amer: >60 mL/min (ref >60)
Glucose, Bld: 101 mg/dL — ABNORMAL HIGH (ref 70–99)
Potassium: 3.9 mmol/L (ref 3.5–5.1)
Sodium: 137 mmol/L (ref 135–145)

## 2018-09-09 LAB — MAGNESIUM: Magnesium: 1.9 mg/dL (ref 1.7–2.4)

## 2018-09-09 MED ORDER — RIVAROXABAN (XARELTO) VTE STARTER PACK (15 & 20 MG): ORAL_TABLET | ORAL | 0 refills | Status: AC

## 2018-09-09 MED ORDER — OXYCODONE HCL 5 MG PO TABS: 5.0000 mg | ORAL_TABLET | ORAL | 0 refills | Status: AC | PRN

## 2018-09-09 MED ORDER — TRAZODONE HCL 50 MG PO TABS: 50.0000 mg | ORAL_TABLET | Freq: Every evening | ORAL | 1 refills | Status: AC | PRN

## 2018-09-09 MED ORDER — TRAZODONE HCL 50 MG PO TABS: 50.0000 mg | ORAL_TABLET | Freq: Every evening | ORAL | Status: DC | PRN

## 2018-09-09 MED ORDER — QUETIAPINE FUMARATE 25 MG PO TABS: 25.0000 mg | ORAL_TABLET | Freq: Every day | ORAL | 3 refills | Status: AC

## 2018-09-09 NOTE — Discharge Summary (Signed)
Physician Discharge Summary  Bradley Campos VEH:[id number] DOB: 1965/10/27 DOA: 09/05/2018  PCP: System, Provider Not In  Admit date: 09/05/2018 Discharge date: 09/09/2018  Admitted From:Home Disposition:   home  Recommendations for Outpatient Follow-up:  1. Follow up with PCP in 1-2 weeks 2. Please obtain BMP/CBC in one week your next doctors visit.  3. Started Xarelto- 15 mg twice daily for 21 days followed by 20 mg daily.  Advised to take his medication daily as prescribed 4. Started Seroquel 25 mg at bedtime as reported of some anxiety/depression due to his current ongoing life stressors 5. Trazodone as needed for bedtime for sleep    Home Health: None Equipment/Devices: None Discharge Condition: Stable CODE STATUS: Full code Diet recommendation: Regular  Brief/Interim Summary: 53 year old with history of Hodgkin's disease, essential hypertension, hypothyroidism came to the hospital complains of atypical chest pain shortness of breath.  He was found to have bilateral submassive pulmonary embolism with cor pulmonale.  Echocardiogram showed strain on the right side of the heart with preserved ejection fraction.  Was started on heparin drip and taken to the ICU for closer monitoring.  Patient received several days of IV heparin and was transitioned to Xarelto.  He was doing much better in the hospital his chest pain resolved.  He was able to ambulate in the hallway for couple days without any chest pain, lightheadedness, dizziness.  His oxygen saturation on room air remained above 92%. He reported to me that he was feeling slightly anxious and depressed secondary to his current ongoing life stressors therefore started on Seroquel at bedtime.  Due to issues with some insomnia he was started on trazodone.  Both of which he tolerated well. At this time is stable for discharge with outpatient follow-up recommendations as stated above.  Patient has recently been transition to moving from out of  town to [place], I have given referral to follow-up either with his primary care physician in the next 1 to 2 weeks TO look for one locally, information for [facility] PCP given.   Discharge Diagnoses:  Active Problems:   Pulmonary embolism (HCC)  Acute bilateral pulmonary embolism, submassive with cor pulmonale, improved - Currently hemodynamically stable.  Continue heparin drip, it appears Eliquis will be costly therefore we will plan on transitioning to Xarelto  -Echocardiogram showed preserved ejection fraction with strain on the right side of the heart.  May need limited repeat echocardiogram in 4 weeks. - Continue heparin drip  -Lower extremity Dopplers-negative. . -Ambulatory pulse ox greater than 92%, remains asymptomatic with ambulation.  Hypothyroidism -Continue Synthroid.  He confirms that he takes  325 milligrams of Synthroid daily  Essential hypertension -Avoiding any antihypertensives  Insomnia and feels depressed -Continue trazodone at bedtime as needed.  Seroquel 25 mg at bedtime daily.  Prescription for both will be given the time of discharge  Consultations:  None  Subjective: Feels better, wishes to go home.  Ambulated in the hallway without any issues this morning.  On room air his oxygen saturation remained greater than 92%.  Discharge Exam: Vitals:   09/08/18 2034 09/09/18 0440  BP: (!) 151/89 138/79  Pulse: 93 91  Resp: 16 12  Temp: 98.4 F (36.9 C) 99.4 F (37.4 C)  SpO2: 92% 91%   Vitals:   09/08/18 1700 09/08/18 1838 09/08/18 2034 09/09/18 0440  BP:  (!) 160/89 (!) 151/89 138/79  Pulse: 84 87 93 91  Resp: 17 16 16 12   Temp:  99.2 F (37.3 C) 98.4 F (36.9 C)  99.4 F (37.4 C)  TempSrc:  Oral Oral Oral  SpO2: 93% 93% 92% 91%  Weight:    107 kg  Height:        General: Pt is alert, awake, not in acute distress Cardiovascular: RRR, S1/S2 +, no rubs, no gallops Respiratory: CTA bilaterally, no wheezing, no rhonchi Abdominal: Soft, NT,  ND, bowel sounds + Extremities: no edema, no cyanosis  Discharge Instructions  Discharge Instructions    Call MD for:  difficulty breathing, headache or visual disturbances   Complete by: As directed    Call MD for:  persistant dizziness or light-headedness   Complete by: As directed    Call MD for:  severe uncontrolled pain   Complete by: As directed    Diet - low sodium heart healthy   Complete by: As directed    Increase activity slowly   Complete by: As directed      Allergies as of 09/09/2018      Reactions   Codeine Hives   Penicillins Hives   Did it involve swelling of the face/tongue/throat, SOB, or low BP? No Did it involve sudden or severe rash/hives, skin peeling, or any reaction on the inside of your mouth or nose? No Did you need to seek medical attention at a hospital or doctor's office? No When did it last happen?Childhood If all above answers are "NO", may proceed with cephalosporin use.      Medication List    TAKE these medications   ALPRAZolam 0.5 MG tablet Commonly known as: XANAX Take 0.5 mg by mouth 3 (three) times daily as needed for anxiety or sleep.   atorvastatin 80 MG tablet Commonly known as: LIPITOR Take 80 mg by mouth daily. Notes to patient: Next Dose 6/27   cyanocobalamin 1000 MCG/ML injection Commonly known as: (VITAMIN B-12) Inject 1,000 mcg into the muscle every 30 (thirty) days. Notes to patient: Once a Month    folic acid 1 MG tablet Commonly known as: FOLVITE Take 1 mg by mouth daily. Notes to patient: Next Dose 6/28   levothyroxine 200 MCG tablet Commonly known as: SYNTHROID Take 200 mcg by mouth daily. Notes to patient: Nest Dose 6/28   levothyroxine 125 MCG tablet Commonly known as: SYNTHROID Take 125 mcg by mouth daily. Notes to patient: Next Dose 6/28   losartan 100 MG tablet Commonly known as: COZAAR Take 100 mg by mouth at bedtime. Notes to patient: Nest Dose 6/27   oxyCODONE 5 MG immediate release  tablet Commonly known as: Oxy IR/ROXICODONE Take 1 tablet (5 mg total) by mouth every 4 (four) hours as needed for up to 3 days for severe pain or breakthrough pain.   pregabalin 50 MG capsule Commonly known as: LYRICA Take 50 mg by mouth 2 (two) times daily as needed (pain).   QUEtiapine 25 MG tablet Commonly known as: SEROQUEL Take 1 tablet (25 mg total) by mouth at bedtime. Notes to patient: Next dose 6/28   RABEprazole 20 MG tablet Commonly known as: ACIPHEX Take 20 mg by mouth 2 (two) times a day. Notes to patient: Next Dose 6/27   Rivaroxaban 15 & 20 MG Tbpk Take as directed on package: Start with one 15mg  tablet by mouth twice a day with food. On Day 22, switch to one 20mg  tablet once a day with food. Notes to patient: Start with 15 mg twice a day and change to 20mg  once daily on day 22. Take  with food   testosterone cypionate 200 MG/ML  injection Commonly known as: DEPOTESTOSTERONE CYPIONATE Inject 100 mg into the muscle every 14 (fourteen) days. Notes to patient: Twice a Month    traZODone 50 MG tablet Commonly known as: DESYREL Take 1 tablet (50 mg total) by mouth at bedtime as needed for up to 60 doses for sleep (use first for sleep).   Verapamil HCl CR 300 MG Cp24 Take 300 mg by mouth at bedtime. Notes to patient: Next Dose 6/27      Follow-up Information    [facility]. Schedule an appointment as soon as possible for a visit in 1 week(s).   Specialty: Internal Medicine Contact information: [place] [address] [phone]         Allergies  Allergen Reactions  . Codeine Hives  . Penicillins Hives    Did it involve swelling of the face/tongue/throat, SOB, or low BP? No Did it involve sudden or severe rash/hives, skin peeling, or any reaction on the inside of your mouth or nose? No Did you need to seek medical attention at a hospital or doctor's office? No When did it last  happen?Childhood If all above answers are "NO", may proceed with cephalosporin use.     You were cared for by a hospitalist during your hospital stay. If you have any questions about your discharge medications or the care you received while you were in the hospital after you are discharged, you can call the unit and asked to speak with the hospitalist on call if the hospitalist that took care of you is not available. Once you are discharged, your primary care physician will handle any further medical issues. Please note that no refills for any discharge medications will be authorized once you are discharged, as it is imperative that you return to your primary care physician (or establish a relationship with a primary care physician if you do not have one) for your aftercare needs so that they can reassess your need for medications and monitor your lab values.   Procedures/Studies: Dg Chest 2 View  Result Date: 09/05/2018 CLINICAL DATA:  Shortness of breath EXAM: CHEST - 2 VIEW COMPARISON:  None. FINDINGS: Lungs are clear. Heart size and pulmonary vascularity are normal. No adenopathy. No bone lesions. IMPRESSION: No edema or consolidation. Electronically Signed   By: Lowella Grip III M.D.   On: 09/05/2018 07:46   Ct Angio Chest Pe W Or Wo Contrast  Result Date: 09/05/2018 CLINICAL DATA:  Shortness of breath.  History of Hodgkin's lymphoma EXAM: CT ANGIOGRAPHY CHEST WITH CONTRAST TECHNIQUE: Multidetector CT imaging of the chest was performed using the standard protocol during bolus administration of intravenous contrast. Multiplanar CT image reconstructions and MIPs were obtained to evaluate the vascular anatomy. CONTRAST:  87 mL OMNIPAQUE IOHEXOL 350 MG/ML SOLN COMPARISON:  Chest radiograph September 05, 2018 FINDINGS: Cardiovascular: There are multiple pulmonary emboli extending into multiple upper and lower lobe pulmonary artery branches. Pulmonary emboli arise from the distal most aspects of each  main pulmonary artery. The right ventricle to left ventricle diameter ratio is 1.5, indicative of right heart strain. There is no thoracic aortic aneurysm or dissection. There are foci of aortic atherosclerosis. The visualized great vessels appear unremarkable. Note that the right innominate and left common carotid arteries arise as a common trunk, an anatomic variant. There is no demonstrable pericardial effusion or pericardial thickening. There are foci of coronary artery calcification. Mediastinum/Nodes: Thyroid appears diminutive. No thyroid lesions evident. No evident thoracic adenopathy. No  esophageal lesions evident. Lungs/Pleura: There are areas of mild atelectatic change bilaterally. There is no evident edema or consolidation. On axial slice 31 series 11, there is a 2 mm nodular opacity in the a pickle segment of the right upper lobe. On axial slice 55 series 11, there is a 2 mm nodular opacity in the anterior segment of the right upper lobe. On axial slice 72 series 11, there is a 4 mm nodular opacity in the medial segment of the right middle lobe. On axial slice 70 series 11, there is a 5 mm nodular opacity in the medial segment right middle lobe. On axial slice 79 series 11, there is a 4 mm nodular opacity in the medial segment right middle lobe. There is a 2 mm nodular opacity in the lateral segment right middle lobe on axial slice 77 series 11. No pleural effusion or pleural thickening evident. No pulmonary infarct is demonstrable on this study. Upper Abdomen: There is hepatic steatosis. Spleen is surgically absent. Gallbladder is absent. Musculoskeletal: There is anterior wedging of the L1 vertebral body. No blastic or lytic bone lesions are evident. No chest wall lesions appreciable. Review of the MIP images confirms the above findings. IMPRESSION: 1. Extensive pulmonary embolus bilaterally with evidence of right heart strain. Positive for acute PE with CT evidence of right heart strain (RV/LV Ratio  = 1.5) consistent with at least submassive (intermediate risk) PE. The presence of right heart strain has been associated with an increased risk of morbidity and mortality. Please activate Code PE by paging [phone]. 2. Several small nodular opacities in the lungs, measuring up to 5 mm in length. Given history of neoplasm, a follow-up CT in approximately 3 months to assess for stability would be warranted. No airspace consolidation. 3.  Spleen absent.  No adenopathy evident. 4.  Hepatic steatosis.  Gallbladder absent. 5. Foci of aortic atherosclerosis are noted. There also foci of coronary artery calcification. Aortic Atherosclerosis (ICD10-I70.0). Critical Value/emergent results were called by telephone at the time of interpretation on 09/05/2018 at 10:27 am to Dr. Gerlene Fee , who verbally acknowledged these results. Electronically Signed   By: Lowella Grip III M.D.   On: 09/05/2018 10:27   Vas [place] Lower Extremity Venous (dvt)  Result Date: 09/07/2018  Lower Venous Study Indications: SOB, pulmonary embolism, and Right heart strain indicated by CT Angio.  Performing Technologist: Toma Copier RVS  Examination Guidelines: A complete evaluation includes B-mode imaging, spectral Doppler, color Doppler, and power Doppler as needed of all accessible portions of each vessel. Bilateral testing is considered an integral part of a complete examination. Limited examinations for reoccurring indications may be performed as noted.  +---------+---------------+---------+-----------+----------+-------------------+ RIGHT    CompressibilityPhasicitySpontaneityPropertiesSummary             +---------+---------------+---------+-----------+----------+-------------------+ CFV      Full           Yes      Yes                                      +---------+---------------+---------+-----------+----------+-------------------+ SFJ      Full                                                              +---------+---------------+---------+-----------+----------+-------------------+  FV Prox  Full           Yes      Yes                                      +---------+---------------+---------+-----------+----------+-------------------+ FV Mid   Full                                                             +---------+---------------+---------+-----------+----------+-------------------+ FV DistalFull           Yes      Yes                                      +---------+---------------+---------+-----------+----------+-------------------+ PFV      Full           Yes      Yes                                      +---------+---------------+---------+-----------+----------+-------------------+ POP      Full           Yes      Yes                                      +---------+---------------+---------+-----------+----------+-------------------+ PTV      Full                                                             +---------+---------------+---------+-----------+----------+-------------------+ PERO     Full                                         Difficult to image                                                        due to muscularture                                                       of the calf         +---------+---------------+---------+-----------+----------+-------------------+   +---------+---------------+---------+-----------+----------+-------------------+ LEFT     CompressibilityPhasicitySpontaneityPropertiesSummary             +---------+---------------+---------+-----------+----------+-------------------+ CFV      Full           Yes      Yes                                      +---------+---------------+---------+-----------+----------+-------------------+  SFJ      Full                                                             +---------+---------------+---------+-----------+----------+-------------------+ FV  Prox  Full           Yes      Yes                                      +---------+---------------+---------+-----------+----------+-------------------+ FV Mid   Full                                                             +---------+---------------+---------+-----------+----------+-------------------+ FV DistalFull           Yes      Yes                                      +---------+---------------+---------+-----------+----------+-------------------+ PFV      Full           Yes      Yes                                      +---------+---------------+---------+-----------+----------+-------------------+ POP      Full           Yes      Yes                                      +---------+---------------+---------+-----------+----------+-------------------+ PTV      Full                                                             +---------+---------------+---------+-----------+----------+-------------------+ PERO     Full                                         Difficult to image                                                        due to the  muscularture of the                                                       calf                +---------+---------------+---------+-----------+----------+-------------------+     Summary: Right: There is no evidence of deep vein thrombosis in the lower extremity. No cystic structure found in the popliteal fossa. Left: There is no evidence of deep vein thrombosis in the lower extremity. No cystic structure found in the popliteal fossa.  *See table(s) above for measurements and observations. Electronically signed by Servando Snare MD on 09/07/2018 at 1:19:39 PM.    Final       The results of significant diagnostics from this hospitalization (including imaging, microbiology, ancillary and laboratory) are listed below for reference.      Microbiology: Recent Results (from the past 240 hour(s))  SARS Coronavirus 2 (CEPHEID - Performed in [facility] hospital lab), Hosp Order     Status: None   Collection Time: 09/05/18  8:08 AM   Specimen: Nasopharyngeal Swab  Result Value Ref Range Status   SARS Coronavirus 2 NEGATIVE NEGATIVE Final    Comment: (NOTE) If result is NEGATIVE SARS-CoV-2 target nucleic acids are NOT DETECTED. The SARS-CoV-2 RNA is generally detectable in upper and lower  respiratory specimens during the acute phase of infection. The lowest  concentration of SARS-CoV-2 viral copies this assay can detect is 250  copies / mL. A negative result does not preclude SARS-CoV-2 infection  and should not be used as the sole basis for treatment or other  patient management decisions.  A negative result may occur with  improper specimen collection / handling, submission of specimen other  than nasopharyngeal swab, presence of viral mutation(s) within the  areas targeted by this assay, and inadequate number of viral copies  (<250 copies / mL). A negative result must be combined with clinical  observations, patient history, and epidemiological information. If result is POSITIVE SARS-CoV-2 target nucleic acids are DETECTED. The SARS-CoV-2 RNA is generally detectable in upper and lower  respiratory specimens dur ing the acute phase of infection.  Positive  results are indicative of active infection with SARS-CoV-2.  Clinical  correlation with patient history and other diagnostic information is  necessary to determine patient infection status.  Positive results do  not rule out bacterial infection or co-infection with other viruses. If result is PRESUMPTIVE POSTIVE SARS-CoV-2 nucleic acids MAY BE PRESENT.   A presumptive positive result was obtained on the submitted specimen  and confirmed on repeat testing.  While 2019 novel coronavirus  (SARS-CoV-2) nucleic acids may be present in the submitted sample   additional confirmatory testing may be necessary for epidemiological  and / or clinical management purposes  to differentiate between  SARS-CoV-2 and other Sarbecovirus currently known to infect humans.  If clinically indicated additional testing with an alternate test  methodology [phone]) is advised. The SARS-CoV-2 RNA is generally  detectable in upper and lower respiratory sp ecimens during the acute  phase of infection. The expected result is Negative. Fact Sheet for Patients:  StrictlyIdeas.no Fact Sheet for Healthcare Providers: BankingDealers.co.za This test is not yet approved or cleared by the [place] FDA and has been authorized for detection and/or diagnosis of SARS-CoV-2 by FDA under an Emergency Use  Authorization (EUA).  This EUA will remain in effect (meaning this test can be used) for the duration of the COVID-19 declaration under Section 564(b)(1) of the Act, 21 U.S.C. section 360bbb-3(b)(1), unless the authorization is terminated or revoked sooner. Performed at [facility], [place] [address], [place], [place] [address]   MRSA PCR Screening     Status: None   Collection Time: 09/05/18  4:42 PM   Specimen: Nasal Mucosa; Nasopharyngeal  Result Value Ref Range Status   MRSA by PCR NEGATIVE NEGATIVE Final    Comment:        The GeneXpert MRSA Assay (FDA approved for NASAL specimens only), is one component of a comprehensive MRSA colonization surveillance program. It is not intended to diagnose MRSA infection nor to guide or monitor treatment for MRSA infections. Performed at [facility], [place] [address] [place], [place] [address]      Labs: BNP (last 3 results) Recent Labs    09/05/18 1207  BNP 144.8*   Basic Metabolic Panel: Recent Labs  Lab 09/05/18 0704 09/08/18 0254 09/09/18 0502  NA 132* 137 137  K 4.0 4.0 3.9  CL 96* 107 105  CO2 20* 23 20*  GLUCOSE  108* 97 101*  BUN 13 6 <5*  CREATININE 1.19 0.85 0.80  CALCIUM 8.5* 8.2* 8.3*  MG  --  1.9 1.9   Liver Function Tests: No results for input(s): AST, ALT, ALKPHOS, BILITOT, PROT, ALBUMIN in the last 168 hours. No results for input(s): LIPASE, AMYLASE in the last 168 hours. No results for input(s): AMMONIA in the last 168 hours. CBC: Recent Labs  Lab 09/05/18 0704 09/06/18 0250 09/07/18 0242 09/08/18 0254 09/09/18 0502  WBC 13.2* 13.1* 13.5* 13.0* 12.4*  NEUTROABS 10.6*  --   --   --   --   HGB 14.0 13.2 12.3* 12.5* 12.3*  HCT 41.2 41.2 39.0 38.1* 37.5*  MCV 111.4* 113.8* 114.4* 112.1* 113.6*  PLT 144* 130* 201 210 261   Cardiac Enzymes: Recent Labs  Lab 09/05/18 0704  TROPONINI 0.04*   BNP: Invalid input(s): POCBNP CBG: No results for input(s): GLUCAP in the last 168 hours. D-Dimer No results for input(s): DDIMER in the last 72 hours. Hgb A1c No results for input(s): HGBA1C in the last 72 hours. Lipid Profile No results for input(s): CHOL, HDL, LDLCALC, TRIG, CHOLHDL, LDLDIRECT in the last 72 hours. Thyroid function studies No results for input(s): TSH, T4TOTAL, T3FREE, THYROIDAB in the last 72 hours.  Invalid input(s): FREET3 Anemia work up No results for input(s): VITAMINB12, FOLATE, FERRITIN, TIBC, IRON, RETICCTPCT in the last 72 hours. Urinalysis No results found for: COLORURINE, APPEARANCEUR, LABSPEC, [place], GLUCOSEU, [place], [place], KETONESUR, PROTEINUR, UROBILINOGEN, NITRITE, LEUKOCYTESUR Sepsis Labs Invalid input(s): PROCALCITONIN,  WBC,  LACTICIDVEN Microbiology Recent Results (from the past 240 hour(s))  SARS Coronavirus 2 (CEPHEID - Performed in [place] hospital lab), Hosp Order     Status: None   Collection Time: 09/05/18  8:08 AM   Specimen: Nasopharyngeal Swab  Result Value Ref Range Status   SARS Coronavirus 2 NEGATIVE NEGATIVE Final    Comment: (NOTE) If result is NEGATIVE SARS-CoV-2 target nucleic acids are NOT DETECTED. The  SARS-CoV-2 RNA is generally detectable in upper and lower  respiratory specimens during the acute phase of infection. The lowest  concentration of SARS-CoV-2 viral copies this assay can detect is 250  copies / mL. A negative result does not preclude SARS-CoV-2 infection  and should not be used as  the sole basis for treatment or other  patient management decisions.  A negative result may occur with  improper specimen collection / handling, submission of specimen other  than nasopharyngeal swab, presence of viral mutation(s) within the  areas targeted by this assay, and inadequate number of viral copies  (<250 copies / mL). A negative result must be combined with clinical  observations, patient history, and epidemiological information. If result is POSITIVE SARS-CoV-2 target nucleic acids are DETECTED. The SARS-CoV-2 RNA is generally detectable in upper and lower  respiratory specimens dur ing the acute phase of infection.  Positive  results are indicative of active infection with SARS-CoV-2.  Clinical  correlation with patient history and other diagnostic information is  necessary to determine patient infection status.  Positive results do  not rule out bacterial infection or co-infection with other viruses. If result is PRESUMPTIVE POSTIVE SARS-CoV-2 nucleic acids MAY BE PRESENT.   A presumptive positive result was obtained on the submitted specimen  and confirmed on repeat testing.  While 2019 novel coronavirus  (SARS-CoV-2) nucleic acids may be present in the submitted sample  additional confirmatory testing may be necessary for epidemiological  and / or clinical management purposes  to differentiate between  SARS-CoV-2 and other Sarbecovirus currently known to infect humans.  If clinically indicated additional testing with an alternate test  methodology [phone]) is advised. The SARS-CoV-2 RNA is generally  detectable in upper and lower respiratory sp ecimens during the acute   phase of infection. The expected result is Negative. Fact Sheet for Patients:  StrictlyIdeas.no Fact Sheet for Healthcare Providers: BankingDealers.co.za This test is not yet approved or cleared by the [place] FDA and has been authorized for detection and/or diagnosis of SARS-CoV-2 by FDA under an Emergency Use Authorization (EUA).  This EUA will remain in effect (meaning this test can be used) for the duration of the COVID-19 declaration under Section 564(b)(1) of the Act, 21 U.S.C. section 360bbb-3(b)(1), unless the authorization is terminated or revoked sooner. Performed at [facility], [place] [address] [place], [place] [address]   MRSA PCR Screening     Status: None   Collection Time: 09/05/18  4:42 PM   Specimen: Nasal Mucosa; Nasopharyngeal  Result Value Ref Range Status   MRSA by PCR NEGATIVE NEGATIVE Final    Comment:        The GeneXpert MRSA Assay (FDA approved for NASAL specimens only), is one component of a comprehensive MRSA colonization surveillance program. It is not intended to diagnose MRSA infection nor to guide or monitor treatment for MRSA infections. Performed at [facility], [place] [address] [place], [place] [address]      Time coordinating discharge:  I have spent 35 minutes face to face with the patient and on the ward discussing the patients care, assessment, plan and disposition with other care givers. >50% of the time was devoted counseling the patient about the risks and benefits of treatment/Discharge disposition and coordinating care.   SIGNED:   Damita Lack, MD  [facility] 09/09/2018, 11:07 AM   If 7PM-7AM, please contact night-coverage [email or web address]

## 2018-09-09 NOTE — Progress Notes (Signed)
SATURATION QUALIFICATIONS: (This note is used to comply with regulatory documentation for home oxygen)  Patient Saturations on Room Air at Rest =97 %  Patient Saturations on Room Air while Ambulating = 95%    

## 2018-09-09 NOTE — Progress Notes (Signed)
Nsg Discharge Note  Admit Date:  09/05/2018 Discharge date: 09/09/2018   Patient discharged home per order. Patient/caregiver able to verbalize understanding.  Discharge Medication: Allergies as of 09/09/2018      Reactions   Codeine Hives   Penicillins Hives   Did it involve swelling of the face/tongue/throat, SOB, or low BP? No Did it involve sudden or severe rash/hives, skin peeling, or any reaction on the inside of your mouth or nose? No Did you need to seek medical attention at a hospital or doctor's office? No When did it last happen?Childhood If all above answers are "NO", may proceed with cephalosporin use.      Medication List    TAKE these medications   ALPRAZolam 0.5 MG tablet Commonly known as: XANAX Take 0.5 mg by mouth 3 (three) times daily as needed for anxiety or sleep.   atorvastatin 80 MG tablet Commonly known as: LIPITOR Take 80 mg by mouth daily. Notes to patient: Next Dose 6/27   cyanocobalamin 1000 MCG/ML injection Commonly known as: (VITAMIN B-12) Inject 1,000 mcg into the muscle every 30 (thirty) days. Notes to patient: Once a Month    folic acid 1 MG tablet Commonly known as: FOLVITE Take 1 mg by mouth daily. Notes to patient: Next Dose 6/28   levothyroxine 200 MCG tablet Commonly known as: SYNTHROID Take 200 mcg by mouth daily. Notes to patient: Nest Dose 6/28   levothyroxine 125 MCG tablet Commonly known as: SYNTHROID Take 125 mcg by mouth daily. Notes to patient: Next Dose 6/28   losartan 100 MG tablet Commonly known as: COZAAR Take 100 mg by mouth at bedtime. Notes to patient: Nest Dose 6/27   oxyCODONE 5 MG immediate release tablet Commonly known as: Oxy IR/ROXICODONE Take 1 tablet (5 mg total) by mouth every 4 (four) hours as needed for up to 3 days for severe pain or breakthrough pain.   pregabalin 50 MG capsule Commonly known as: LYRICA Take 50 mg by mouth 2 (two) times daily as needed (pain).   QUEtiapine 25 MG  tablet Commonly known as: SEROQUEL Take 1 tablet (25 mg total) by mouth at bedtime. Notes to patient: Next dose 6/28   RABEprazole 20 MG tablet Commonly known as: ACIPHEX Take 20 mg by mouth 2 (two) times a day. Notes to patient: Next Dose 6/27   Rivaroxaban 15 & 20 MG Tbpk Take as directed on package: Start with one 15mg  tablet by mouth twice a day with food. On Day 22, switch to one 20mg  tablet once a day with food. Notes to patient: Start with 15 mg twice a day and change to 20mg  once daily on day 22. Take  with food   testosterone cypionate 200 MG/ML injection Commonly known as: DEPOTESTOSTERONE CYPIONATE Inject 100 mg into the muscle every 14 (fourteen) days. Notes to patient: Twice a Month    traZODone 50 MG tablet Commonly known as: DESYREL Take 1 tablet (50 mg total) by mouth at bedtime as needed for up to 60 doses for sleep (use first for sleep).   Verapamil HCl CR 300 MG Cp24 Take 300 mg by mouth at bedtime. Notes to patient: Next Dose 6/27       Discharge Assessment: Vitals:   09/09/18 0440 09/09/18 1309  BP: 138/79 (!) 153/91  Pulse: 91 79  Resp: 12 15  Temp: 99.4 F (37.4 C) 98.5 F (36.9 C)  SpO2: 91% 93%   Skin clean, dry and intact without evidence of skin break down, no  evidence of skin tears noted. IV catheter discontinued intact. Site without signs and symptoms of complications - no redness or edema noted at insertion site, patient denies c/o pain - only slight tenderness at site.  Dressing with slight pressure applied.  D/c Instructions-Education: Discharge instructions given to patient/family with verbalized understanding. D/c education completed with patient/family including follow up instructions, medication list, d/c activities limitations if indicated, with other d/c instructions as indicated by MD - patient able to verbalize understanding, all questions fully answered. Patient instructed to return to ED, call 911, or call MD for any changes in  condition.  Patient escorted via [place], and D/C home via private auto.  Kaden Dunkel, Jolene Schimke, RN 09/09/2018 2:33 PM

## 2020-12-24 IMAGING — CR CHEST - 2 VIEW
3 series · 3 of 3 positions shown · non-contrast
Comparison: None.

CLINICAL DATA: Shortness of breath

EXAM:
CHEST - 2 VIEW

[w chest lat (1 of 2)]
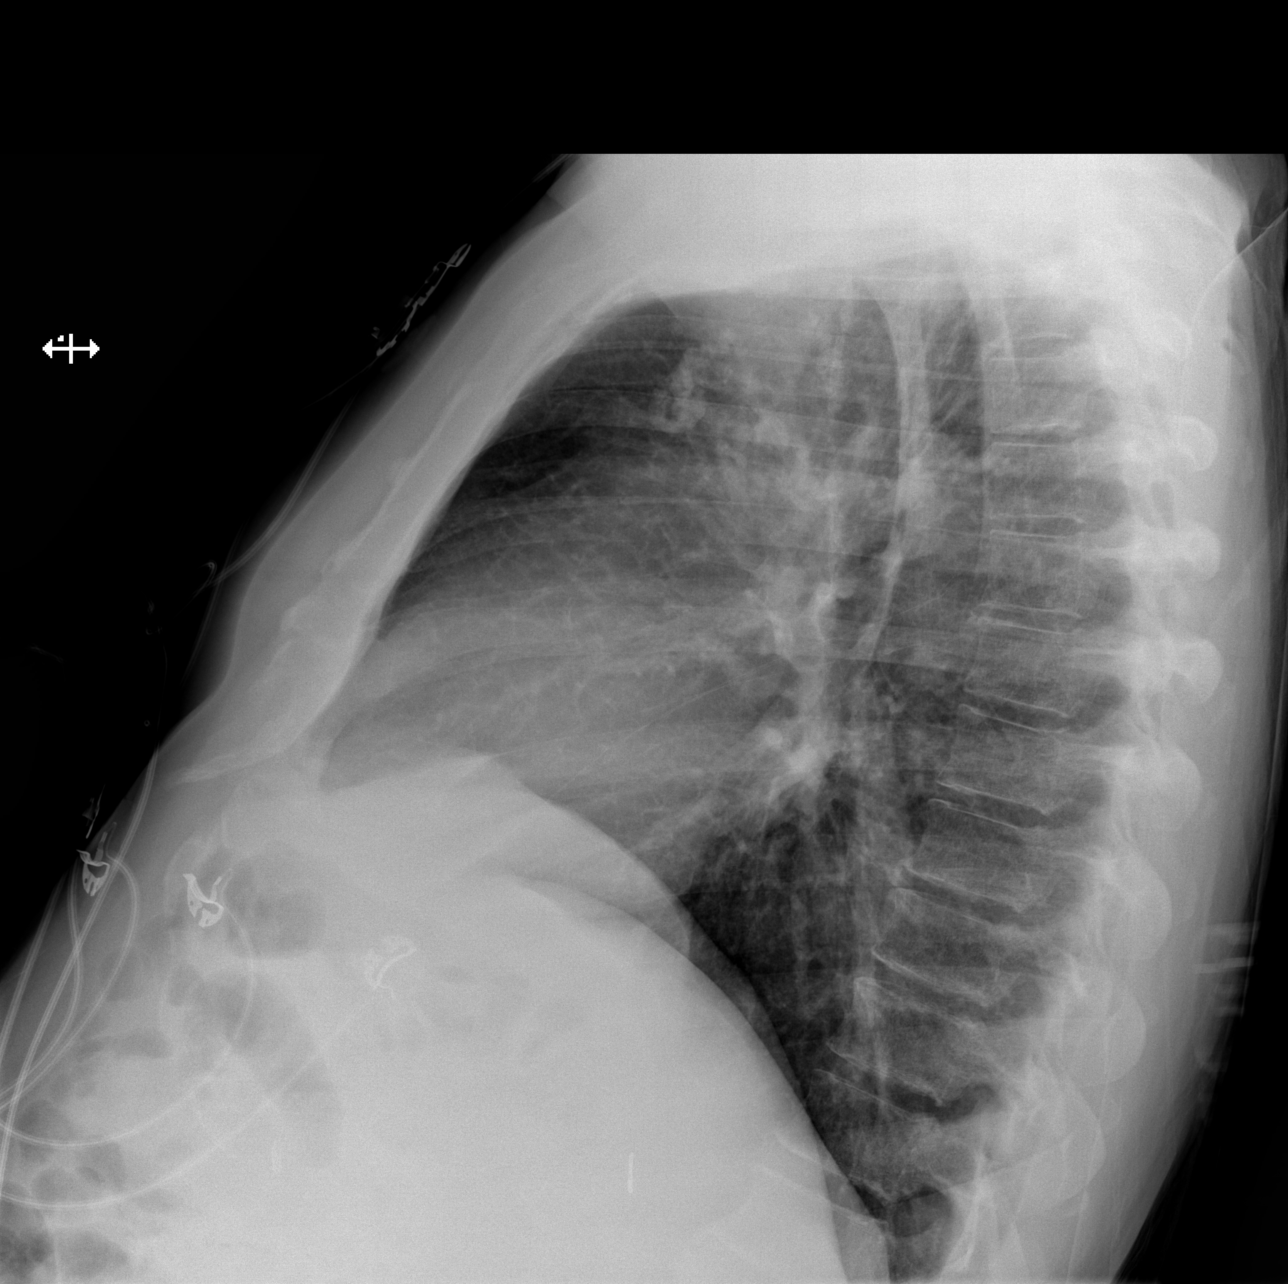

[w chest lat (2 of 2)]
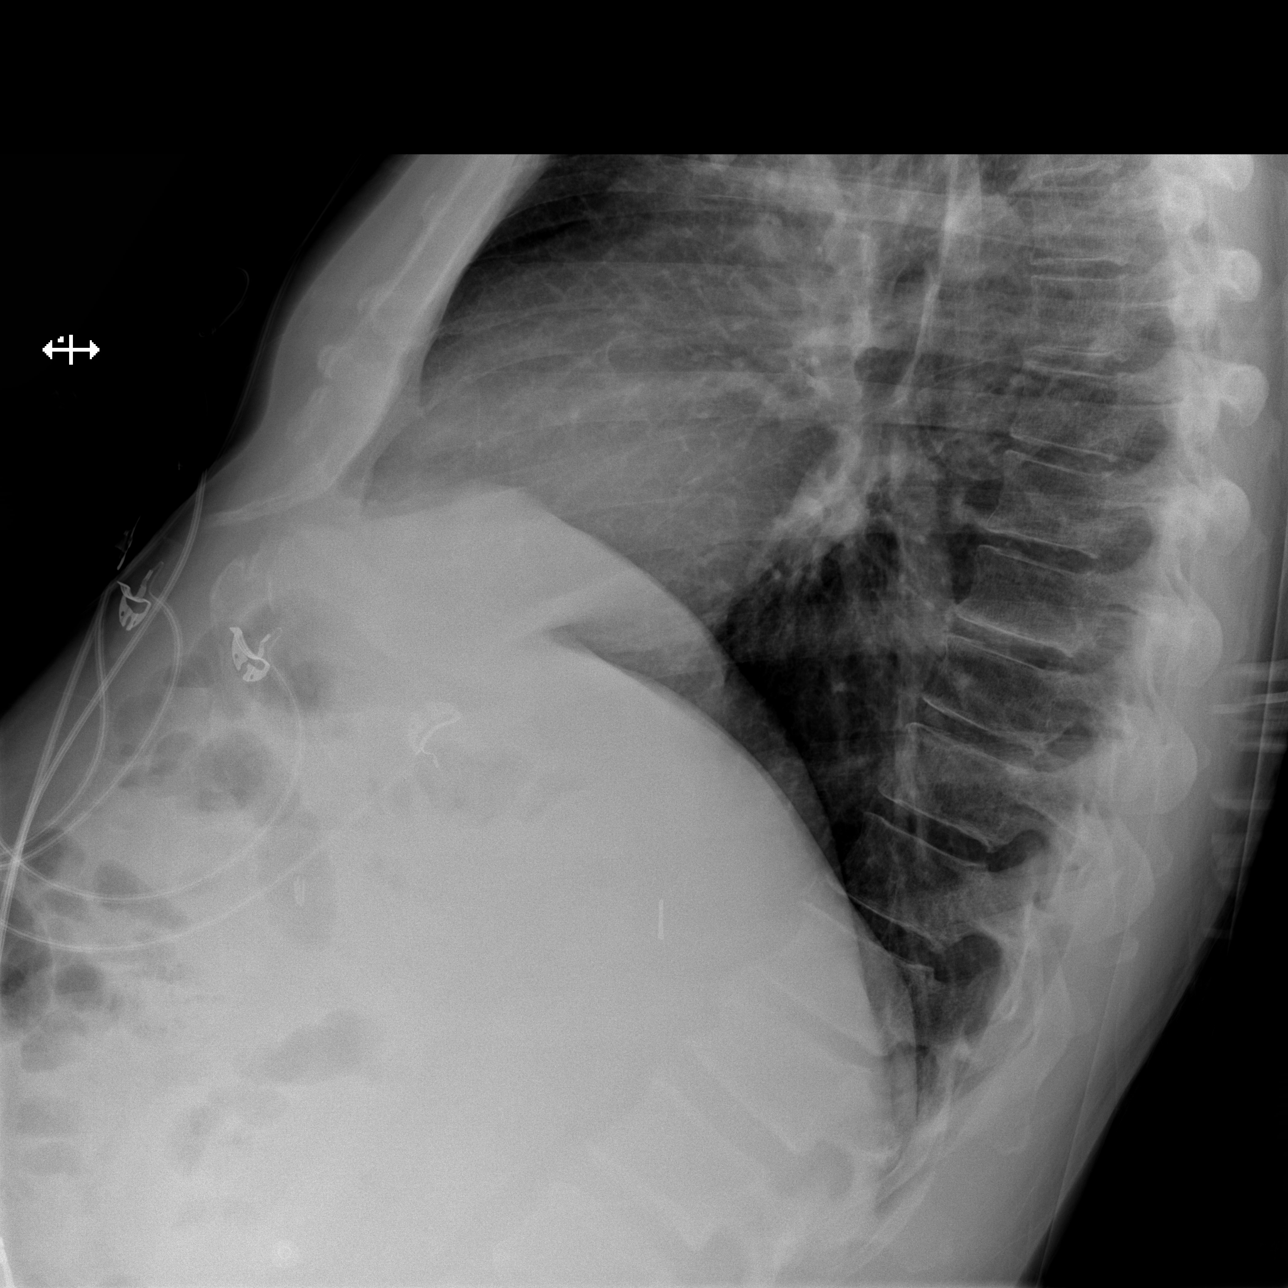

[x chest ap]
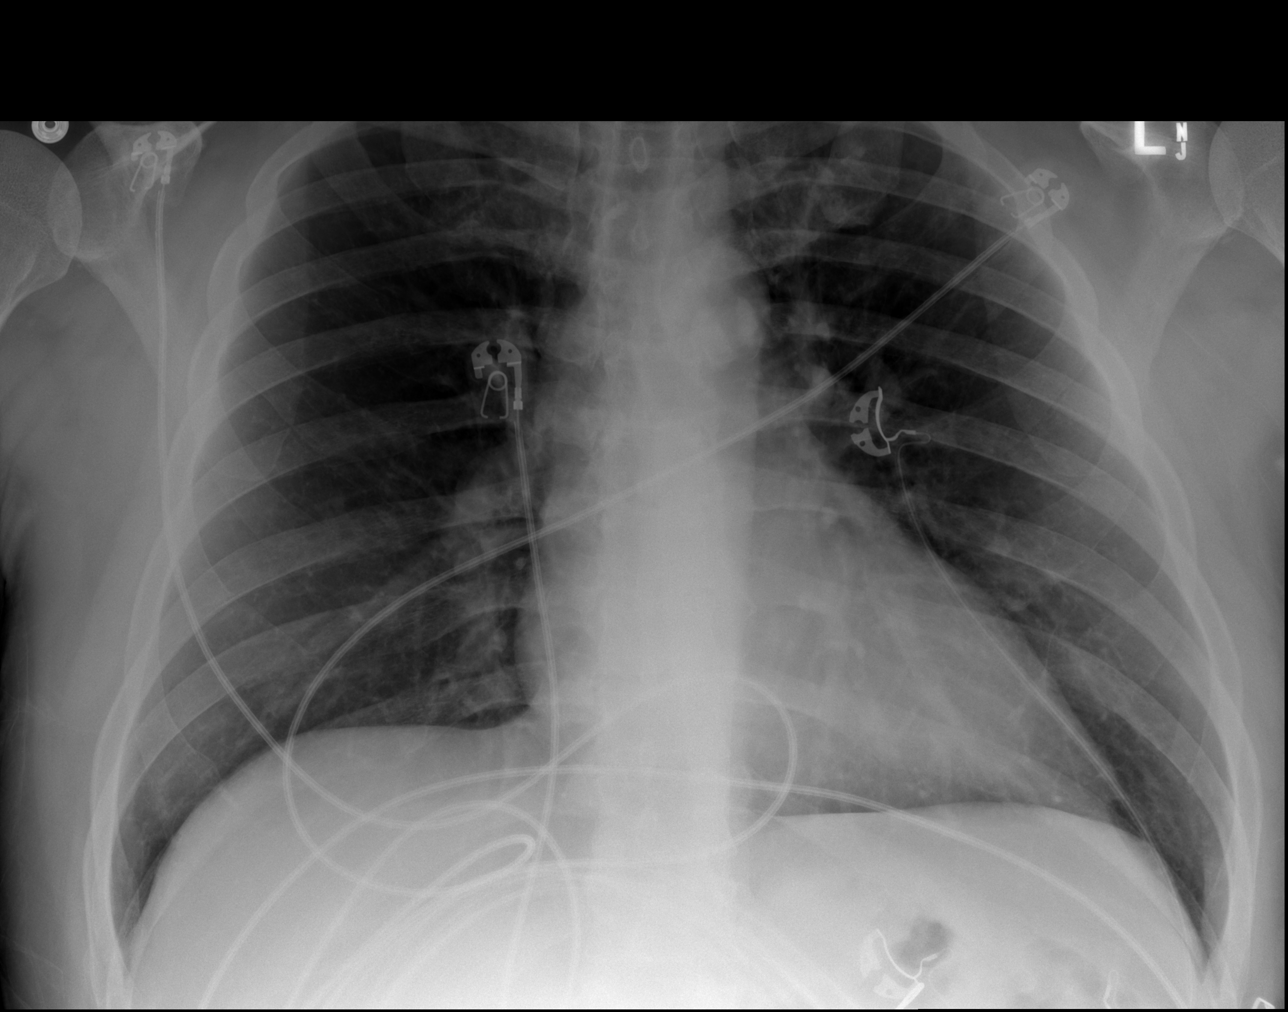

[3 of 3 positions shown; findings below may reference images not displayed]

FINDINGS: Lungs are clear. Heart size and pulmonary vascularity are normal. No
adenopathy. No bone lesions.
IMPRESSION: No edema or consolidation.
